# Patient Record
Sex: Male | Born: 1965 | Race: White | Hispanic: No | Marital: Married | Smoking: Former smoker
Health system: Southern US, Community
[De-identification: ages and names within clinical notes are randomized; demographics above are authoritative.]

## PROBLEM LIST (undated history)

## (undated) DIAGNOSIS — R51 Headache: Secondary | ICD-10-CM

## (undated) DIAGNOSIS — G8929 Other chronic pain: Secondary | ICD-10-CM

## (undated) DIAGNOSIS — F319 Bipolar disorder, unspecified: Secondary | ICD-10-CM

## (undated) DIAGNOSIS — R519 Headache, unspecified: Secondary | ICD-10-CM

## (undated) HISTORY — DX: Other chronic pain: G89.29

## (undated) HISTORY — DX: Headache: R51

## (undated) HISTORY — DX: Bipolar disorder, unspecified: F31.9

## (undated) HISTORY — DX: Headache, unspecified: R51.9

---

## 2001-09-29 ENCOUNTER — Observation Stay (HOSPITAL_COMMUNITY): Admission: RE | Admit: 2001-09-29 | Discharge: 2001-09-30 | Payer: Self-pay | Admitting: Urology

## 2001-09-29 ENCOUNTER — Encounter: Payer: Self-pay | Admitting: Emergency Medicine

## 2001-10-15 ENCOUNTER — Ambulatory Visit (HOSPITAL_BASED_OUTPATIENT_CLINIC_OR_DEPARTMENT_OTHER): Admission: RE | Admit: 2001-10-15 | Discharge: 2001-10-15 | Payer: Self-pay | Admitting: Urology

## 2002-05-10 ENCOUNTER — Encounter: Payer: Self-pay | Admitting: Internal Medicine

## 2002-05-10 ENCOUNTER — Encounter: Admission: RE | Admit: 2002-05-10 | Discharge: 2002-05-10 | Payer: Self-pay | Admitting: Internal Medicine

## 2002-06-28 ENCOUNTER — Emergency Department (HOSPITAL_COMMUNITY): Admission: EM | Admit: 2002-06-28 | Discharge: 2002-06-28 | Payer: Self-pay | Admitting: Emergency Medicine

## 2002-08-29 ENCOUNTER — Encounter: Payer: Self-pay | Admitting: Emergency Medicine

## 2002-08-29 ENCOUNTER — Emergency Department (HOSPITAL_COMMUNITY): Admission: EM | Admit: 2002-08-29 | Discharge: 2002-08-29 | Payer: Self-pay | Admitting: Emergency Medicine

## 2015-08-19 DIAGNOSIS — G479 Sleep disorder, unspecified: Secondary | ICD-10-CM | POA: Insufficient documentation

## 2015-08-19 DIAGNOSIS — R200 Anesthesia of skin: Secondary | ICD-10-CM | POA: Insufficient documentation

## 2015-08-19 DIAGNOSIS — G43009 Migraine without aura, not intractable, without status migrainosus: Secondary | ICD-10-CM | POA: Insufficient documentation

## 2015-09-23 DIAGNOSIS — M5412 Radiculopathy, cervical region: Secondary | ICD-10-CM | POA: Insufficient documentation

## 2015-10-21 DIAGNOSIS — M5030 Other cervical disc degeneration, unspecified cervical region: Secondary | ICD-10-CM | POA: Insufficient documentation

## 2016-07-06 ENCOUNTER — Ambulatory Visit (INDEPENDENT_AMBULATORY_CARE_PROVIDER_SITE_OTHER): Payer: BLUE CROSS/BLUE SHIELD | Admitting: Pulmonary Disease

## 2016-07-06 ENCOUNTER — Encounter: Payer: Self-pay | Admitting: Pulmonary Disease

## 2016-07-06 VITALS — BP 120/80 | HR 74

## 2016-07-06 DIAGNOSIS — R05 Cough: Secondary | ICD-10-CM

## 2016-07-06 DIAGNOSIS — R059 Cough, unspecified: Secondary | ICD-10-CM

## 2016-07-06 DIAGNOSIS — R918 Other nonspecific abnormal finding of lung field: Secondary | ICD-10-CM | POA: Diagnosis not present

## 2016-07-06 LAB — NITRIC OXIDE: Nitric Oxide: 20

## 2016-07-06 MED ORDER — PREDNISONE 10 MG PO TABS: ORAL_TABLET | ORAL | 0 refills | Status: DC

## 2016-07-06 MED ORDER — CHLORPHENIRAMINE MALEATE 4 MG PO TABS: 4.0000 mg | ORAL_TABLET | Freq: Every day | ORAL | 0 refills | Status: DC

## 2016-07-06 MED ORDER — AZITHROMYCIN 250 MG PO TABS: ORAL_TABLET | ORAL | 0 refills | Status: DC

## 2016-07-06 NOTE — Progress Notes (Signed)
   Subjective:    Patient ID: Philip Landry, male    DOB: [birth year], 51 y.o.   MRN: [id number]  HPI    Review of Systems  Constitutional: Negative for fever and unexpected weight change.  HENT: Negative for congestion, dental problem, ear pain, nosebleeds, postnasal drip, rhinorrhea, sinus pressure, sneezing, sore throat and trouble swallowing.   Eyes: Negative for redness and itching.  Respiratory: Positive for cough ( productive) and shortness of breath. Negative for chest tightness and wheezing.   Cardiovascular: Negative for palpitations and leg swelling.  Gastrointestinal: Negative for nausea and vomiting.  Genitourinary: Negative for dysuria.  Musculoskeletal: Negative for joint swelling.  Skin: Negative for rash.  Neurological: Positive for headaches.  Hematological: Does not bruise/bleed easily.  Psychiatric/Behavioral: Negative for dysphoric mood. The patient is not nervous/anxious.        Objective:   Physical Exam        Assessment & Plan:

## 2016-07-06 NOTE — Progress Notes (Signed)
Past surgical history He  has no past surgical history on file.  Family history His family history is not on file.  Social history He  reports that he quit smoking about 10 years ago. His smoking use included Cigarettes. He smoked 1.00 pack per day. He has never used smokeless tobacco. He reports that he does not drink alcohol or use drugs.  Allergies  Allergen Reactions  . Codeine Nausea And Vomiting    Review of Systems  Constitutional: Negative for fever and unexpected weight change.  HENT: Negative for congestion, dental problem, ear pain, nosebleeds, postnasal drip, rhinorrhea, sinus pressure, sneezing, sore throat and trouble swallowing.   Eyes: Negative for redness and itching.  Respiratory: Positive for cough ( productive) and shortness of breath. Negative for chest tightness and wheezing.   Cardiovascular: Negative for palpitations and leg swelling.  Gastrointestinal: Negative for nausea and vomiting.  Genitourinary: Negative for dysuria.  Musculoskeletal: Negative for joint swelling.  Skin: Negative for rash.  Neurological: Positive for headaches.  Hematological: Does not bruise/bleed easily.  Psychiatric/Behavioral: Negative for dysphoric mood. The patient is not nervous/anxious.     No current outpatient prescriptions on file prior to visit.   No current facility-administered medications on file prior to visit.     Chief Complaint  Patient presents with  . Pulmonary Consult    coughing all the time, dry annoying cough without any mucus production, only relief is when he lays down, started with bronchitis and the cough never went away    Pulmonary tests CT chest 05/08/16 >> 4 mm RUL nodule, 3 mm RML nodule FeNO 07/06/16 >> 20  Past medical history He  has a past medical history of Bipolar depression (HCC) and Chronic headaches.  Vital signs BP 120/80 (BP Location: Left Arm, Cuff Size: Normal)   Pulse 74   SpO2 96%   History of present illness Philip Landry is a 51 y.o. male former smoker with cough.  He developed a respiratory infection in January.  He has persistent cough since then.  His cough is dry and happens more when he sits up or stands up.  If he lays down it seems to get better.  He felt better on prednisone and antibiotics.  He was seen by Dr. Blenda Nicely in [place].  He has been tried on therapies for asthma, post nasal drip, and reflux.  None of these have helped.  He denies sinus congestion, post nasal drip, sore throat, globus, chest pain, wheeze, fever, hemoptysis, skin rash, leg swelling.  He denies sick exposures.  No history of asthma, allergies, pneumonia or tuberculosis.  Denies travel history.  Owns a Science writer.  Has pet dogs.  Started smoking at 72, smoke 3/4 ppd, quit 2008.  CT chest from February 2018 showed small nodules.  Physical exam  General - No distress Eyes - pupils reactive ENT - No sinus tenderness, no oral exudate, no LAN, no thyromegaly, TM clear, pupils equal/reactive Cardiac - s1s2 regular, no murmur, pulses symmetric Chest - No wheeze/rales/dullness, good air entry, normal respiratory excursion Back - No focal tenderness Abd - Soft, non-tender, no organomegaly, + bowel sounds Ext - No edema Neuro - Normal strength, cranial nerves intact Skin - No rashes Psych - Normal mood, and behavior   Discussion 51 yo male former smoker with persistent, non productive cough after recent respiratory infection.  He has not improved with therapies for asthma, reflux, or post nasal drip.  Assessment/plan  Chronic cough. - will give additional  course of prednisone and zpak - will have him use chlorpheniramine 4 mg qhs for now - he can do salt water gargles - sip water with urge to cough - use sugarless candy to keep mouth moist - voice rest - if no improvement, then will need assessment by ENT  Lung nodules with hx of tobacco abuse. - will need f/u CT chest w/o contrast in February  2019   Patient Instructions  Prednisone 10 mg pill >> 3 pills daily for 2 days, 2 pills daily for 2 days, 1 pill daily for 2 days Zithromax 250 mg pill >> 2 pills on day 1, then 1 pill daily for next 4 days Chlorpheniramine 4 mg pill nightly Salt water gargles twice per day Sip water when you have urge to cough Sugarless candy to keep mouth moist 1 teaspoon of local honey twice per day Rest your voice as able  Follow up in 4 weeks with Dr. Craige Cotta or Nurse practitioner    Coralyn Helling, MD [facility] Pager:  [phone] 07/06/2016, 2:55 PM

## 2016-07-06 NOTE — Patient Instructions (Signed)
Prednisone 10 mg pill >> 3 pills daily for 2 days, 2 pills daily for 2 days, 1 pill daily for 2 days Zithromax 250 mg pill >> 2 pills on day 1, then 1 pill daily for next 4 days Chlorpheniramine 4 mg pill nightly Salt water gargles twice per day Sip water when you have urge to cough Sugarless candy to keep mouth moist 1 teaspoon of local honey twice per day Rest your voice as able  Follow up in 4 weeks with Dr. Craige Cotta or Nurse practitioner

## 2016-08-02 ENCOUNTER — Ambulatory Visit (INDEPENDENT_AMBULATORY_CARE_PROVIDER_SITE_OTHER): Payer: BLUE CROSS/BLUE SHIELD | Admitting: Adult Health

## 2016-08-02 ENCOUNTER — Encounter: Payer: Self-pay | Admitting: Adult Health

## 2016-08-02 DIAGNOSIS — R05 Cough: Secondary | ICD-10-CM | POA: Diagnosis not present

## 2016-08-02 DIAGNOSIS — R053 Chronic cough: Secondary | ICD-10-CM

## 2016-08-02 MED ORDER — BENZONATATE 200 MG PO CAPS: 200.0000 mg | ORAL_CAPSULE | Freq: Three times a day (TID) | ORAL | 1 refills | Status: DC | PRN

## 2016-08-02 NOTE — Patient Instructions (Addendum)
Begin Delsym 2 tsp Twice daily  , for cough .  Begin Tessalon Three times a day  As needed  For cough  Begin Zyrtec 10mg  ..daily in am .  Continue on Chlortrimeton 4mg  2 At bedtime .  Begin Prilosec 20 mg daily before meal in am .  Sips of water to soothe throat .  No MINTS .   Follow up Dr. Craige CottaSood  In 6 weeks and As needed   Please contact office for sooner follow up if symptoms do not improve or worsen or seek emergency care

## 2016-08-02 NOTE — Progress Notes (Signed)
 @Patient  ID: Philip Landry, male    DOB: [birth year], 51 y.o.   MRN: [id number]  Chief Complaint  Patient presents with  . Follow-up    Cough     Referring provider: No ref. provider found  HPI: 51 year old male former smoker seen for pulmonary consult 07/06/2016 for chronic cough  TEST  Pulmonary tests CT chest 05/08/16 >> 4 mm RUL nodule, 3 mm RML nodule FeNO 07/06/16 >> 20  08/02/2016 Follow up : Chronic cough  Patient presents for a one-month follow-up. Patient was seen last month for pulmonary consult for chronic cough. Patient complains that he's had a persistent cough since January 2018 after he developed an upper rest or infection. He has been treated with prednisone and antibiotics. CT chest on February 25 showed a 4 mm right upper lobe nodule and a 3 mm right middle lobe nodule. Patient was given a course of Z-Pak and prednisone. Started on Chlor-Trimeton at bedtime.. Patient returns today with improvement . Decreased cough but not gone.  Was seen by Dr. Esther Hardyhondri in [place] . Had PFT , told no Asthma or COPD per pt . No records available.      Allergies  Allergen Reactions  . Codeine Nausea And Vomiting     There is no immunization history on file for this patient.  Past Medical History:  Diagnosis Date  . Bipolar depression (HCC)   . Chronic headaches     Tobacco History: History  Smoking Status  . Former Smoker  . Packs/day: 1.00  . Years: 8.00  . Types: Cigarettes  . Quit date: 2008  Smokeless Tobacco  . Never Used   Counseling given: Not Answered   Outpatient Encounter Prescriptions as of 08/02/2016  Medication Sig  . chlorpheniramine (CHLOR-TRIMETON) 4 MG tablet Take 1 tablet (4 mg total) by mouth at bedtime.  . lamoTRIgine (LAMICTAL) 200 MG tablet Take 400 mg by mouth daily.  Marland Kitchen. zolpidem (AMBIEN) 10 MG tablet Take 10 mg by mouth at bedtime as needed for sleep.  . benzonatate (TESSALON) 200 MG capsule Take 1 capsule (200 mg total) by mouth 3  (three) times daily as needed for cough.  . [DISCONTINUED] azithromycin (ZITHROMAX) 250 MG tablet 2 pills on day 1, then 1 pill daily for next 4 days (Patient not taking: Reported on 08/02/2016)  . [DISCONTINUED] predniSONE (DELTASONE) 10 MG tablet 3 pills daily for 2 days, 2 pills daily for 2 days, 1 pill daily for 2 days (Patient not taking: Reported on 08/02/2016)   No facility-administered encounter medications on file as of 08/02/2016.      Review of Systems  Constitutional:   No  weight loss, night sweats,  Fevers, chills, fatigue, or  lassitude.  HEENT:   No headaches,  Difficulty swallowing,  Tooth/dental problems, or  Sore throat,                No sneezing, itching, ear ache,  +nasal congestion, post nasal drip,   CV:  No chest pain,  Orthopnea, PND, swelling in lower extremities, anasarca, dizziness, palpitations, syncope.   GI  No heartburn, indigestion, abdominal pain, nausea, vomiting, diarrhea, change in bowel habits, loss of appetite, bloody stools.   Resp:   No chest wall deformity  Skin: no rash or lesions.  GU: no dysuria, change in color of urine, no urgency or frequency.  No flank pain, no hematuria   MS:  No joint pain or swelling.  No decreased range of motion.  No back pain.  Physical Exam  BP 118/74 (BP Location: Left Arm, Cuff Size: Normal)   Pulse 73   SpO2 97%   GEN: A/Ox3; pleasant , NAD, well nourished   HEENT:  [place]/AT,  EACs-clear, TMs-wnl, NOSE-clear, THROAT-clear, no lesions, no postnasal drip or exudate noted.   NECK:  Supple w/ fair ROM; no JVD; normal carotid impulses w/o bruits; no thyromegaly or nodules palpated; no lymphadenopathy.    RESP  Clear  P & A; w/o, wheezes/ rales/ or rhonchi. no accessory muscle use, no dullness to percussion  CARD:  RRR, no m/r/g, no peripheral edema, pulses intact, no cyanosis or clubbing.  GI:   Soft & nt; nml bowel sounds; no organomegaly or masses detected.   Musco: Warm bil, no deformities or joint  swelling noted.   Neuro: alert, no focal deficits noted.    Skin: Warm, no lesions or rashes    Lab Results:  CBC No results found for: WBC, RBC, HGB, HCT, PLT, MCV, MCH, MCHC, RDW, LYMPHSABS, MONOABS, EOSABS, BASOSABS  BMET No results found for: NA, K, CL, CO2, GLUCOSE, BUN, CREATININE, CALCIUM, GFRNONAA, GFRAA  BNP No results found for: BNP  ProBNP No results found for: PROBNP  Imaging: No results found.   Assessment & Plan:   Chronic cough Upper airway cough syndrome - some improvement on regimen for cough control and AR prevention  Add GERD and maximize AR/Cough prevention  If not better refer to ENT  Has had PFT , will try to get results.   Plan  . Patient Instructions  Begin Delsym 2 tsp Twice daily  , for cough .  Begin Tessalon Three times a day  As needed  For cough  Begin Zyrtec 10mg  ..daily in am .  Continue on Chlortrimeton 4mg  2 At bedtime .  Begin Prilosec 20 mg daily before meal in am .  Sips of water to soothe throat .  No MINTS .   Follow up Dr. Craige Cotta  In 6 weeks and As needed   Please contact office for sooner follow up if symptoms do not improve or worsen or seek emergency care            Rubye Oaks, NP 08/02/2016

## 2016-08-02 NOTE — Assessment & Plan Note (Signed)
Upper airway cough syndrome - some improvement on regimen for cough control and AR prevention  Add GERD and maximize AR/Cough prevention  If not better refer to ENT  Has had PFT , will try to get results.   Plan  . Patient Instructions  Begin Delsym 2 tsp Twice daily  , for cough .  Begin Tessalon Three times a day  As needed  For cough  Begin Zyrtec 10mg  ..daily in am .  Continue on Chlortrimeton 4mg  2 At bedtime .  Begin Prilosec 20 mg daily before meal in am .  Sips of water to soothe throat .  No MINTS .   Follow up Dr. Craige CottaSood  In 6 weeks and As needed   Please contact office for sooner follow up if symptoms do not improve or worsen or seek emergency care

## 2016-08-03 NOTE — Progress Notes (Signed)
I have reviewed and agree with assessment/plan.  Coralyn HellingVineet Owens Hara, MD [facility] 08/03/2016, 11:30 AM Pager:  [phone]

## 2016-08-05 ENCOUNTER — Ambulatory Visit: Payer: BLUE CROSS/BLUE SHIELD | Admitting: Adult Health

## 2016-09-13 ENCOUNTER — Ambulatory Visit: Payer: BLUE CROSS/BLUE SHIELD | Admitting: Adult Health

## 2016-10-14 ENCOUNTER — Telehealth: Payer: Self-pay | Admitting: Pulmonary Disease

## 2016-10-14 ENCOUNTER — Ambulatory Visit (INDEPENDENT_AMBULATORY_CARE_PROVIDER_SITE_OTHER): Payer: BLUE CROSS/BLUE SHIELD | Admitting: Adult Health

## 2016-10-14 ENCOUNTER — Encounter: Payer: Self-pay | Admitting: Adult Health

## 2016-10-14 DIAGNOSIS — R05 Cough: Secondary | ICD-10-CM | POA: Diagnosis not present

## 2016-10-14 DIAGNOSIS — R911 Solitary pulmonary nodule: Secondary | ICD-10-CM | POA: Diagnosis not present

## 2016-10-14 DIAGNOSIS — R053 Chronic cough: Secondary | ICD-10-CM

## 2016-10-14 NOTE — Assessment & Plan Note (Signed)
Improved response w/ trigger prevention and cough control meds.   Plan  Patient Instructions  May use Delsym 2 tsp Twice daily  , for cough .  Use Tessalon Three times a day  As needed  For cough  Continue on Zyrtec 10mg  ..daily in am .  Continue on Chlortrimeton 4mg  2 At bedtime .  Continue on Prilosec 20 mg daily before meal in am .  Sips of water to soothe throat .  No MINTS .   Follow up Dr. Craige CottaSood  In 3 months and As needed   Set up CT chest in 04/2017 .  Please contact office for sooner follow up if symptoms do not improve or worsen or seek emergency care

## 2016-10-14 NOTE — Patient Instructions (Addendum)
May use Delsym 2 tsp Twice daily  , for cough .  Use Tessalon Three times a day  As needed  For cough  Continue on Zyrtec 10mg  ..daily in am .  Continue on Chlortrimeton 4mg  2 At bedtime .  Continue on Prilosec 20 mg daily before meal in am .  Sips of water to soothe throat .  No MINTS .   Follow up Dr. Craige CottaSood  In 3 months and As needed   Set up CT chest in 04/2017 .  Please contact office for sooner follow up if symptoms do not improve or worsen or seek emergency care

## 2016-10-14 NOTE — Assessment & Plan Note (Signed)
follow up CT-scan of the chest in 04/2017

## 2016-10-14 NOTE — Progress Notes (Signed)
 @Patient  ID: Philip Landry, male    DOB: Sep 06, 1965, 51 y.o.   MRN: [id number]  Chief Complaint  Patient presents with  . Follow-up    Cough    Referring provider: No ref. provider found  HPI: 51 year old male former smoker seen for pulmonary consult 07/06/2016 for chronic cough  TEST  Pulmonary tests CT chest 05/08/16 >> 4 mm RUL nodule, 3 mm RML nodule FeNO 07/06/16 >> 20  10/14/2016 Follow up : Chronic cough  Patient presents for a two-month follow-up. Patient was seen in April for Pulmicort consult for chronic cough. He has been having a persistent cough since January 2018. He been treated with prednisone and antibiotics. CT chest showed 4 mm right upper lobe and 3 mm right middle lobe. Patient was treated with cough control regimen with Delsym Tessalon. Started on Zyrtec and Chlor-Trimeton. Also started on Prilosec for reflux. Patient returns today feeling better . Cough is significantly decreased but not totally gone. Stopped using allergy meds.  Still has some throat clearing.  Patient denies any chest pain, orthopnea, PND, or increased leg swelling.  Allergies  Allergen Reactions  . Codeine Nausea And Vomiting     There is no immunization history on file for this patient.  Past Medical History:  Diagnosis Date  . Bipolar depression (HCC)   . Chronic headaches     Tobacco History: History  Smoking Status  . Former Smoker  . Packs/day: 1.00  . Years: 8.00  . Types: Cigarettes  . Quit date: 2008  Smokeless Tobacco  . Never Used   Counseling given: Not Answered   Outpatient Encounter Prescriptions as of 10/14/2016  Medication Sig  . lamoTRIgine (LAMICTAL) 200 MG tablet 1 in the morning and 2 at bedtime  . loratadine (CLARITIN) 10 MG tablet Take 10 mg by mouth daily.  Marland Kitchen. omeprazole (PRILOSEC) 40 MG capsule Take 40 mg by mouth daily.  Marland Kitchen. zolpidem (AMBIEN) 10 MG tablet Take 10 mg by mouth at bedtime as needed for sleep.  . benzonatate (TESSALON) 200 MG  capsule Take 1 capsule (200 mg total) by mouth 3 (three) times daily as needed for cough. (Patient not taking: Reported on 10/14/2016)  . chlorpheniramine (CHLOR-TRIMETON) 4 MG tablet Take 1 tablet (4 mg total) by mouth at bedtime. (Patient not taking: Reported on 10/14/2016)   No facility-administered encounter medications on file as of 10/14/2016.      Review of Systems  Constitutional:   No  weight loss, night sweats,  Fevers, chills, fatigue, or  lassitude.  HEENT:   No headaches,  Difficulty swallowing,  Tooth/dental problems, or  Sore throat,                No sneezing, itching, ear ache,  +nasal congestion, post nasal drip,   CV:  No chest pain,  Orthopnea, PND, swelling in lower extremities, anasarca, dizziness, palpitations, syncope.   GI  No heartburn, indigestion, abdominal pain, nausea, vomiting, diarrhea, change in bowel habits, loss of appetite, bloody stools.   Resp:    No wheezing.  No chest wall deformity  Skin: no rash or lesions.  GU: no dysuria, change in color of urine, no urgency or frequency.  No flank pain, no hematuria   MS:  No joint pain or swelling.  No decreased range of motion.  No back pain.    Physical Exam  BP 124/74 (BP Location: Left Arm, Cuff Size: Normal)   Pulse 69   Ht 5\' 5"  (1.651 m)  Wt 145 lb 6.4 oz (66 kg)   SpO2 97%   BMI 24.20 kg/m   GEN: A/Ox3; pleasant , NAD thin    HEENT:  [place]/AT,  EACs-clear, TMs-wnl, NOSE-clear, THROAT-clear, no lesions, no postnasal drip or exudate noted.   NECK:  Supple w/ fair ROM; no JVD; normal carotid impulses w/o bruits; no thyromegaly or nodules palpated; no lymphadenopathy.    RESP  Clear  P & A; w/o, wheezes/ rales/ or rhonchi. no accessory muscle use, no dullness to percussion  CARD:  RRR, no m/r/g, no peripheral edema, pulses intact, no cyanosis or clubbing.  GI:   Soft & nt; nml bowel sounds; no organomegaly or masses detected.   Musco: Warm bil, no deformities or joint swelling noted.    Neuro: alert, no focal deficits noted.    Skin: Warm, no lesions or rashes    Lab Results:  CBC No results found for: WBC, RBC, HGB, HCT, PLT, MCV, MCH, MCHC, RDW, LYMPHSABS, MONOABS, EOSABS, BASOSABS  BMET No results found for: NA, K, CL, CO2, GLUCOSE, BUN, CREATININE, CALCIUM, GFRNONAA, GFRAA  BNP No results found for: BNP  ProBNP No results found for: PROBNP  Imaging: No results found.   Assessment & Plan:   Chronic cough Improved response w/ trigger prevention and cough control meds.   Plan  Patient Instructions  May use Delsym 2 tsp Twice daily  , for cough .  Use Tessalon Three times a day  As needed  For cough  Continue on Zyrtec 10mg  ..daily in am .  Continue on Chlortrimeton 4mg  2 At bedtime .  Continue on Prilosec 20 mg daily before meal in am .  Sips of water to soothe throat .  No MINTS .   Follow up Dr. Craige CottaSood  In 3 months and As needed   Set up CT chest in 04/2017 .  Please contact office for sooner follow up if symptoms do not improve or worsen or seek emergency care         Lung nodule follow up CT-scan of the chest in 04/2017      Rubye Oaksammy Parrett, NP 10/14/2016

## 2016-10-16 NOTE — Progress Notes (Signed)
I have reviewed and agree with assessment/plan.  Alverna Fawley, MD [facility]/Critical [facility] 10/16/2016, 11:28 PM Pager:  [phone]  

## 2016-10-17 NOTE — Telephone Encounter (Signed)
Error

## 2016-12-06 ENCOUNTER — Encounter (INDEPENDENT_AMBULATORY_CARE_PROVIDER_SITE_OTHER): Payer: BLUE CROSS/BLUE SHIELD | Admitting: Podiatry

## 2016-12-06 NOTE — Progress Notes (Signed)
This encounter was created in error - please disregard.

## 2016-12-20 ENCOUNTER — Encounter: Payer: Self-pay | Admitting: Podiatry

## 2016-12-20 ENCOUNTER — Ambulatory Visit (INDEPENDENT_AMBULATORY_CARE_PROVIDER_SITE_OTHER): Payer: BLUE CROSS/BLUE SHIELD

## 2016-12-20 ENCOUNTER — Ambulatory Visit (INDEPENDENT_AMBULATORY_CARE_PROVIDER_SITE_OTHER): Payer: BLUE CROSS/BLUE SHIELD | Admitting: Podiatry

## 2016-12-20 DIAGNOSIS — M722 Plantar fascial fibromatosis: Secondary | ICD-10-CM

## 2016-12-20 MED ORDER — METHYLPREDNISOLONE 4 MG PO TBPK: ORAL_TABLET | ORAL | 0 refills | Status: DC

## 2016-12-20 MED ORDER — MELOXICAM 15 MG PO TABS: 15.0000 mg | ORAL_TABLET | Freq: Every day | ORAL | 3 refills | Status: DC

## 2016-12-20 NOTE — Patient Instructions (Signed)

## 2016-12-20 NOTE — Progress Notes (Signed)
  Subjective:  Patient ID: Philip Landry, male    DOB: [birth year],  MRN: [id number] HPI Chief Complaint  Patient presents with  . Foot Pain    Plantar heel/arch left - aching x several months, AM pain, tried heel lift from chiropractor, bought Brooks sneakers-made worse    51 y.o. male presents with the above complaint. He states that his plantar heel and arch pain to his left foot has been present for about 6-8 months. He states that mornings are particularly bad he show healed left from hot chiropractor which he thinks made this worse he bought a pair of tennis shoes which she also thinks may have worsened the condition. Denies any trauma.   Past Medical History:  Diagnosis Date  . Bipolar depression (HCC)   . Chronic headaches    No past surgical history on file.  Current Outpatient Prescriptions:  .  eszopiclone (LUNESTA) 2 MG TABS tablet, TAKE 1/2-1 TABLET BY MOUTH AT BEDTIME AS NEEDED DO NOT TAKE WITHIN 30 MINUTES OF EATING, Disp: , Rfl: 2 .  lamoTRIgine (LAMICTAL) 200 MG tablet, 1 in the morning and 2 at bedtime, Disp: , Rfl:  .  loratadine (CLARITIN) 10 MG tablet, Take 10 mg by mouth daily., Disp: , Rfl:  .  omeprazole (PRILOSEC) 40 MG capsule, Take 40 mg by mouth daily., Disp: , Rfl:   Allergies  Allergen Reactions  . Codeine Nausea And Vomiting   Review of Systems  All other systems reviewed and are negative.  Objective:  There were no vitals filed for this visit.  General: Well developed, nourished, in no acute distress, alert and oriented x3   Dermatological: Skin is warm, dry and supple bilateral. Nails x 10 are well maintained; remaining integument appears unremarkable at this time. There are no open sores, no preulcerative lesions, no rash or signs of infection present.  Vascular: Dorsalis Pedis artery and Posterior Tibial artery pedal pulses are 2/4 bilateral with immedate capillary fill time. Pedal hair growth present. No varicosities and no lower extremity  edema present bilateral.   Neruologic: Grossly intact via light touch bilateral. Vibratory intact via tuning fork bilateral. Protective threshold with Semmes Wienstein monofilament intact to all pedal sites bilateral. Patellar and Achilles deep tendon reflexes 2+ bilateral. No Babinski or clonus noted bilateral.   Musculoskeletal: No gross boney pedal deformities bilateral. No pain, crepitus, or limitation noted with foot and ankle range of motion bilateral. Muscular strength 5/5 in all groups tested bilateral. Minor severe pain on palpation medial calcaneal tubercle of the left heel. No medial lateral compression. Right heel is nontender and supple.  Gait: Unassisted, Nonantalgic.    Radiographs:  Radiographs taken today demonstrate soft tissue increase in density at the plantar fascia calcaneal insertion site of the left heel. No fractures are noted.  Assessment & Plan:   Assessment: Plantar fasciitis left.  Plan: Discussed etiology pathology conservative versus surgical therapies. At this point we started him on Medrol Dosepak to be followed by meloxicam. Injected left heel today with a local anesthetic placement plantar fascia brace and ice splint. Discussed prep and shoe gear stretching exercises ice therapy sugar modifications. He was given both oral and written home going instructions for care of this problem as well as stretching exercises.     Max T. Sargent, [place]

## 2017-01-20 ENCOUNTER — Ambulatory Visit: Payer: BLUE CROSS/BLUE SHIELD | Admitting: Pulmonary Disease

## 2017-01-26 ENCOUNTER — Ambulatory Visit: Payer: BLUE CROSS/BLUE SHIELD | Admitting: Podiatry

## 2017-01-26 ENCOUNTER — Encounter: Payer: Self-pay | Admitting: Podiatry

## 2017-01-26 DIAGNOSIS — M722 Plantar fascial fibromatosis: Secondary | ICD-10-CM

## 2017-01-26 NOTE — Progress Notes (Signed)
He presents today states that he was doing very well she states that he really doesn't like the night boot he figured that his work and the weekends is making his foot hurt.  Objective: Vital signs are stable alert and oriented 3 he has pain on palpation medial calcaneal tubercle of the right heel.  Assessment: Plantar fasciitis right.  Plan: Injected his right heel again today with Kenalog and local anesthetic referred him to Philip Landry for orthotics.

## 2017-02-06 ENCOUNTER — Encounter: Payer: Self-pay | Admitting: Pulmonary Disease

## 2017-02-06 ENCOUNTER — Ambulatory Visit: Payer: BLUE CROSS/BLUE SHIELD | Admitting: Pulmonary Disease

## 2017-02-06 VITALS — BP 110/68 | HR 69 | Ht 65.0 in | Wt 147.4 lb

## 2017-02-06 DIAGNOSIS — R918 Other nonspecific abnormal finding of lung field: Secondary | ICD-10-CM

## 2017-02-06 DIAGNOSIS — R05 Cough: Secondary | ICD-10-CM

## 2017-02-06 DIAGNOSIS — R519 Headache, unspecified: Secondary | ICD-10-CM

## 2017-02-06 DIAGNOSIS — K219 Gastro-esophageal reflux disease without esophagitis: Secondary | ICD-10-CM | POA: Diagnosis not present

## 2017-02-06 DIAGNOSIS — R059 Cough, unspecified: Secondary | ICD-10-CM

## 2017-02-06 DIAGNOSIS — R51 Headache: Secondary | ICD-10-CM | POA: Diagnosis not present

## 2017-02-06 DIAGNOSIS — R058 Other specified cough: Secondary | ICD-10-CM

## 2017-02-06 MED ORDER — AZELASTINE-FLUTICASONE 137-50 MCG/ACT NA SUSP: 1.0000 | Freq: Two times a day (BID) | NASAL | 5 refills | Status: AC

## 2017-02-06 MED ORDER — MONTELUKAST SODIUM 10 MG PO TABS
10.0000 mg | ORAL_TABLET | Freq: Every day | ORAL | 5 refills | Status: DC
Start: 2017-02-06 — End: 2017-05-05

## 2017-02-06 MED ORDER — CHLORPHENIRAMINE MALEATE 4 MG PO TABS: 4.0000 mg | ORAL_TABLET | Freq: Every day | ORAL | 0 refills | Status: AC

## 2017-02-06 NOTE — Progress Notes (Signed)
Current Outpatient Medications on File Prior to Visit  Medication Sig  . eszopiclone (LUNESTA) 2 MG TABS tablet TAKE 1/2-1 TABLET BY MOUTH AT BEDTIME AS NEEDED DO NOT TAKE WITHIN 30 MINUTES OF EATING  . lamoTRIgine (LAMICTAL) 200 MG tablet 1 in the morning and 2 at bedtime  . loratadine (CLARITIN) 10 MG tablet Take 10 mg by mouth daily.  . meloxicam (MOBIC) 15 MG tablet Take 1 tablet (15 mg total) by mouth daily.  Marland Kitchen. omeprazole (PRILOSEC) 40 MG capsule Take 40 mg by mouth daily.   No current facility-administered medications on file prior to visit.      Chief Complaint  Patient presents with  . Follow-up    Pt doing better, has dry cough on and off on occassion.     Pulmonary tests CT chest 05/08/16 >> 4 mm RUL nodule, 3 mm RML nodule FeNO 07/06/16 >> 20  Past medical history Bipolar, Headaches  Past surgical history, Family history, Social history, Allergies all reviewed.  Vital Signs BP 110/68 (BP Location: Left Arm, Cuff Size: Normal)   Pulse 69   Ht 5\' 5"  (1.651 m)   Wt 147 lb 6.4 oz (66.9 kg)   SpO2 97%   BMI 24.53 kg/m   History of Present Illness Philip Landry is a 51 y.o. male former smoker with cough and lung nodules.  He still gets dry cough.  He has irritation in her throat and feels like something is stuck in there.  He gets sinus pressure and then a headache.  This happens with change in temperature, or with different exposures.  He has been using prilosec, but not sure this has made a difference.  Tessalon didn't help.  He has been sipping water.  He still gets the urge to clear his throat.  He is not having wheeze, or chest tightness.  No fever, sputum, swelling, or rashes.  He is using claritin.  He doesn't remember if chlorpheniramine helped or not.  [place], but we haven't received this yet.  He didn't tolerate flonase >> bad taste.  Physical Exam  General - No distress Eyes - pupils reactive ENT - No sinus tenderness, mild septal  deviation, pale nasal mucosa, no oral exudate, no LAN, TM clear b/l Cardiac - s1s2 regular, no murmur Chest - No wheeze/rales/dullness Back - No focal tenderness Abd - Soft, non-tender Ext - No edema Neuro - Normal strength Skin - No rashes Psych - normal mood, and behavior   Assessment/Plan  Chronic cough from upper airway cough syndrome. - likely from irritant rhinitis leading to sinus headaches - he has mild septal deviaiton - will have him try dymista - restart chlorpheniramine - add singulair - get copy of PFT from [place] - will arrange for CT sinus when he gets CT chest in February 2019  Possible laryngopharyngeal reflux. - seems less likely - will have him gradually taper of prilosec  Lung nodules. - will need f/u CT chest w/o contrast in February 2019   Patient Instructions  Chlorpheniramine 4 mg pill nightly Montelukast (singulair) 10 mg pill nighty Dymista 1 spray each nostril twice per day Sip water with urge to cough Salt water gargles twice per day as needed Avoid forcing cough or clearing throat Can try decreasing prilosec to 20 mg daily >> if okay after two weeks, then can try stopping prilosec  Will get a copy of your breathing test from Dr. Terrilee Croakhodri's office in [place]  Will schedule CT sinus for  February 2019 when you have your CT chest  Follow up in 3 months    Coralyn HellingVineet Malachai Schalk, MD [facility] Pager:  [phone] 02/06/2017, 10:07 AM

## 2017-02-06 NOTE — Patient Instructions (Addendum)
Chlorpheniramine 4 mg pill nightly Montelukast (singulair) 10 mg pill nighty Dymista 1 spray each nostril twice per day Sip water with urge to cough Salt water gargles twice per day as needed Avoid forcing cough or clearing throat Can try decreasing prilosec to 20 mg daily >> if okay after two weeks, then can try stopping prilosec  Will get a copy of your breathing test from Dr. Terrilee Croakhodri's office in [place]  Will schedule CT sinus for February 2019 when you have your CT chest  Follow up in 3 months

## 2017-02-09 ENCOUNTER — Ambulatory Visit (INDEPENDENT_AMBULATORY_CARE_PROVIDER_SITE_OTHER): Payer: BLUE CROSS/BLUE SHIELD | Admitting: Orthotics

## 2017-02-09 DIAGNOSIS — M722 Plantar fascial fibromatosis: Secondary | ICD-10-CM

## 2017-02-09 NOTE — Progress Notes (Signed)

## 2017-03-09 ENCOUNTER — Ambulatory Visit: Payer: BLUE CROSS/BLUE SHIELD | Admitting: Podiatry

## 2017-03-09 ENCOUNTER — Ambulatory Visit: Payer: BLUE CROSS/BLUE SHIELD | Admitting: Orthotics

## 2017-03-09 ENCOUNTER — Encounter: Payer: Self-pay | Admitting: Podiatry

## 2017-03-09 DIAGNOSIS — M722 Plantar fascial fibromatosis: Secondary | ICD-10-CM

## 2017-03-09 NOTE — Progress Notes (Signed)
Patient came in today to pick up custom made foot orthotics.  The goals were accomplished and the patient reported no dissatisfaction with said orthotics.  Patient was advised of breakin period and how to report any issues. 

## 2017-03-09 NOTE — Progress Notes (Signed)
Philip Landry presents today for to pick up his orthotics.  He states that his left heel is really bothering him.  Objective: Pain on palpation left heel.  Assessment: Plantar fasciitis left foot.  Plan: Dispensed orthotics and dispensed a new plantar fascial brace.

## 2017-05-03 ENCOUNTER — Ambulatory Visit (INDEPENDENT_AMBULATORY_CARE_PROVIDER_SITE_OTHER)
Admission: RE | Admit: 2017-05-03 | Discharge: 2017-05-03 | Disposition: A | Discharge status: Home / Self Care | Payer: BLUE CROSS/BLUE SHIELD | Source: Ambulatory Visit | Attending: Pulmonary Disease | Admitting: Pulmonary Disease

## 2017-05-03 ENCOUNTER — Other Ambulatory Visit: Payer: BLUE CROSS/BLUE SHIELD

## 2017-05-03 DIAGNOSIS — R918 Other nonspecific abnormal finding of lung field: Secondary | ICD-10-CM

## 2017-05-05 ENCOUNTER — Ambulatory Visit: Payer: BLUE CROSS/BLUE SHIELD | Admitting: Pulmonary Disease

## 2017-05-05 ENCOUNTER — Encounter: Payer: Self-pay | Admitting: Pulmonary Disease

## 2017-05-05 VITALS — BP 122/82 | HR 69 | Ht 65.0 in | Wt 148.0 lb

## 2017-05-05 DIAGNOSIS — Z7722 Contact with and (suspected) exposure to environmental tobacco smoke (acute) (chronic): Secondary | ICD-10-CM | POA: Diagnosis not present

## 2017-05-05 DIAGNOSIS — K219 Gastro-esophageal reflux disease without esophagitis: Secondary | ICD-10-CM | POA: Diagnosis not present

## 2017-05-05 DIAGNOSIS — R918 Other nonspecific abnormal finding of lung field: Secondary | ICD-10-CM

## 2017-05-05 DIAGNOSIS — R05 Cough: Secondary | ICD-10-CM | POA: Diagnosis not present

## 2017-05-05 DIAGNOSIS — R058 Other specified cough: Secondary | ICD-10-CM

## 2017-05-05 NOTE — Patient Instructions (Signed)
Follow up in 1 year.

## 2017-05-05 NOTE — Progress Notes (Signed)
[facility]  Chief Complaint  Patient presents with  . Follow-up    Pt cough is better, but still there at times. Pt is not coughing as much    Vital signs: BP 122/82 (BP Location: Left Arm, Cuff Size: Normal)   Pulse 69   Ht 5\' 5"  (1.651 m)   Wt 148 lb (67.1 kg)   SpO2 97%   BMI 24.63 kg/m   History of Present Illness: Philip Landry is a 52 y.o. male former smoker with cough and lung nodules.  He is here to review his CT chest.  Mild paraseptal emphysema, stable tiny nodule.  His cough is much better.  He is still using claritin and prilosec.  astelin had a funny taste.  He is not having wheeze,chest tightness, fever, hemoptysis, dyspnea, skin rash, or leg swelling.  He doesn't smoke, but has second hand exposure at work.   Physical Exam:  General - pleasant Eyes - pupils reactive ENT - no sinus tenderness, no oral exudate, no LAN Cardiac - regular, no murmur Chest - no wheeze, rales Abd - soft, non tender Ext - no edema Skin - no rashes Neuro - normal strength Psych - normal mood  Assessment/Plan:  Upper airway cough syndrome. - continue claritin  Laryngopharyngeal reflux. - continue prilosec  Lung nodules. - CT chest reviewed with him - no additional follow up needed  History of tobacco abuse. Mild paraseptal emphysema. Second hand tobacco exposure. - had extensive discussion about how second hand tobacco exposure can impact his lung function   Patient Instructions  Follow up in 1 year    Coralyn HellingVineet Deosha Werden, MD [facility] 05/05/2017, 4:24 PM Pager:  [phone]  Flow Sheet  Pulmonary tests: CT chest 05/08/16 >> 4 mm RUL nodule, 3 mm RML nodule FeNO 07/06/16 >> 20 CT chest 05/03/17 >> mild paraseptal emphysema, 3 mm subpleural nodes  Past Medical History: He  has a past medical history of Bipolar depression (HCC) and Chronic headaches.  Past Surgical History: He  has no past  surgical history on file.  Family History: His family history is not on file.  Social History: He  reports that he quit smoking about 11 years ago. His smoking use included cigarettes. He has a 8.00 pack-year smoking history. he has never used smokeless tobacco. He reports that he does not drink alcohol or use drugs.  Medications: Allergies as of 05/05/2017      Reactions   Codeine Nausea And Vomiting      Medication List        Accurate as of 05/05/17  4:24 PM. Always use your most recent med list.          Azelastine-Fluticasone 137-50 MCG/ACT Susp Place 1 spray into the nose 2 (two) times daily.   chlorpheniramine 4 MG tablet Commonly known as:  CHLOR-TRIMETON Take 1 tablet (4 mg total) by mouth at bedtime.   lamoTRIgine 200 MG tablet Commonly known as:  LAMICTAL 1 in the morning and 2 at bedtime   loratadine 10 MG tablet Commonly known as:  CLARITIN Take 10 mg by mouth daily.   omeprazole 40 MG capsule Commonly known as:  PRILOSEC Take 40 mg by mouth daily.

## 2017-07-18 ENCOUNTER — Other Ambulatory Visit: Payer: Self-pay | Admitting: Pulmonary Disease

## 2018-03-23 ENCOUNTER — Telehealth: Payer: Self-pay | Admitting: Podiatry

## 2018-03-23 NOTE — Telephone Encounter (Signed)
Pt left message stating he is in need of a new pair of inserts for his shoes.  I returned call and had to leave a message that since it has been over a year since pt was seen he would need to see the doctor for an updated visit so we could bill the insurance if he wanted [place] to bill the insurance. We could get him in to see Raiford Noble on the same day. I asked pt to call me to schedule the  appts.

## 2018-04-03 ENCOUNTER — Encounter: Payer: Self-pay | Admitting: Podiatry

## 2018-04-03 ENCOUNTER — Ambulatory Visit: Payer: BLUE CROSS/BLUE SHIELD | Admitting: Podiatry

## 2018-04-03 DIAGNOSIS — M722 Plantar fascial fibromatosis: Secondary | ICD-10-CM | POA: Diagnosis not present

## 2018-04-03 NOTE — Progress Notes (Signed)
He presented today with a history of plantar fasciitis and really on and wanted to see [facility] for orthotics.  Orthotics were casted today.

## 2018-04-26 ENCOUNTER — Ambulatory Visit (INDEPENDENT_AMBULATORY_CARE_PROVIDER_SITE_OTHER): Payer: BLUE CROSS/BLUE SHIELD | Admitting: Orthotics

## 2018-04-26 DIAGNOSIS — M722 Plantar fascial fibromatosis: Secondary | ICD-10-CM

## 2018-04-26 NOTE — Progress Notes (Signed)
Patient came in today to pick up custom made foot orthotics.  The goals were accomplished and the patient reported no dissatisfaction with said orthotics.  Patient was advised of breakin period and how to report any issues. 

## 2019-05-12 IMAGING — CT CT CHEST W/O CM
2 of 3 series · 15 of 36 positions shown, 18 images · non-contrast
Comparison: 05/08/2016.

CLINICAL DATA: Lung nodules.

EXAM:
CT CHEST WITHOUT CONTRAST
TECHNIQUE: Multidetector CT imaging of the chest was performed following the
standard protocol without IV contrast.

[Series 2: thorax · axial · 0.70mm/px · z∈[-274,-30]mm · 12 of 144 slices shown, 15 images]
[im 11/144  mediastinal]
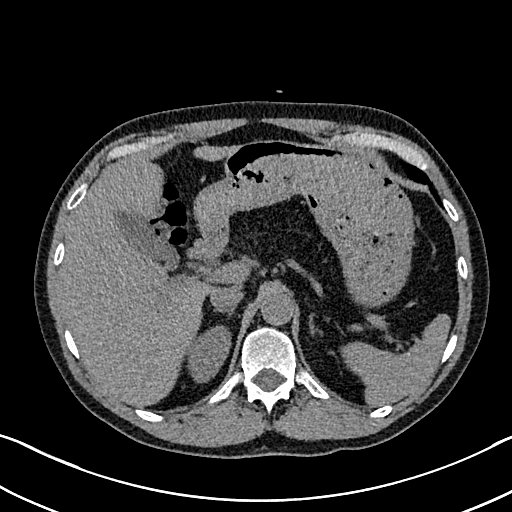
[im 11/144  lung]
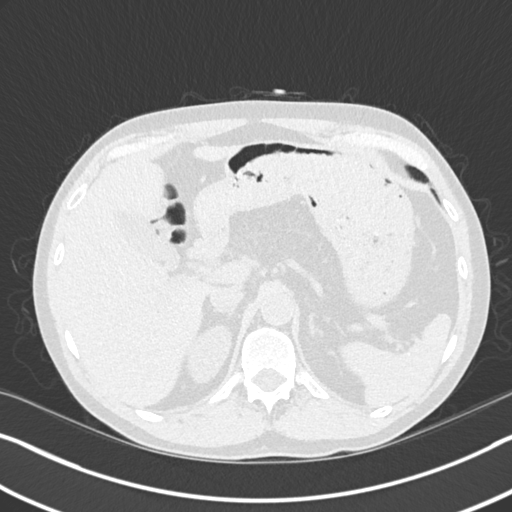
[im 22/144  lung]
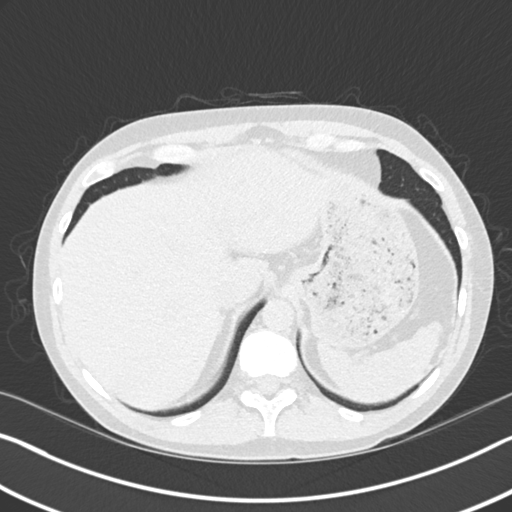
[im 32/144  lung]
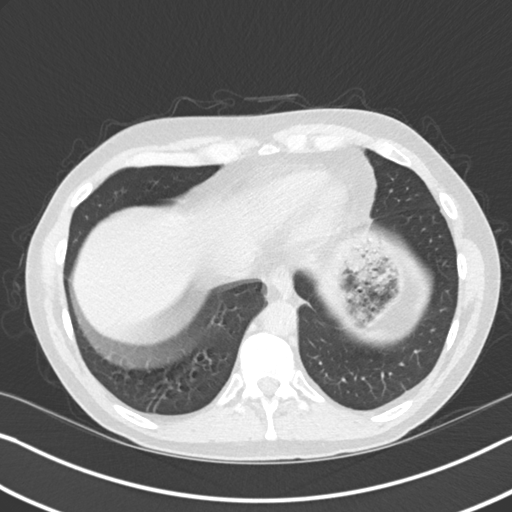
[im 43/144  lung]
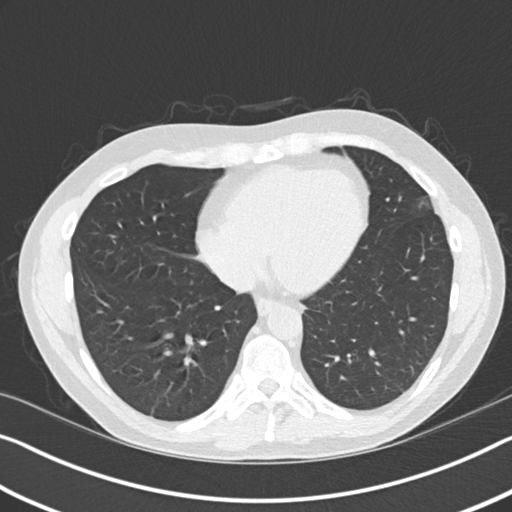
[im 53/144  mediastinal]
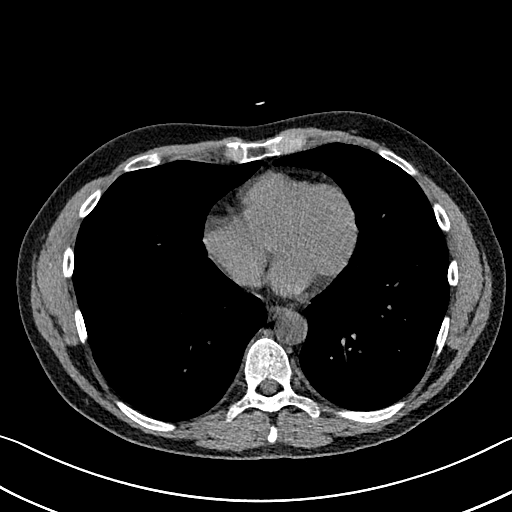
[im 53/144  lung]
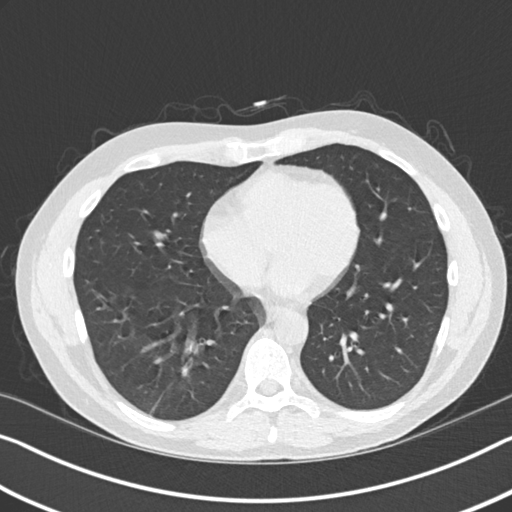
[im 64/144  lung]
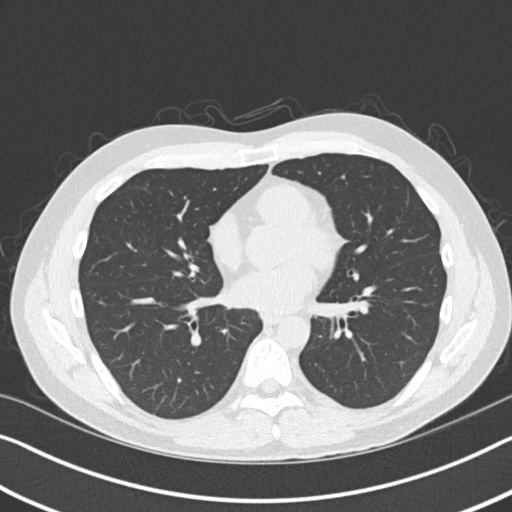
[im 80/144  lung]
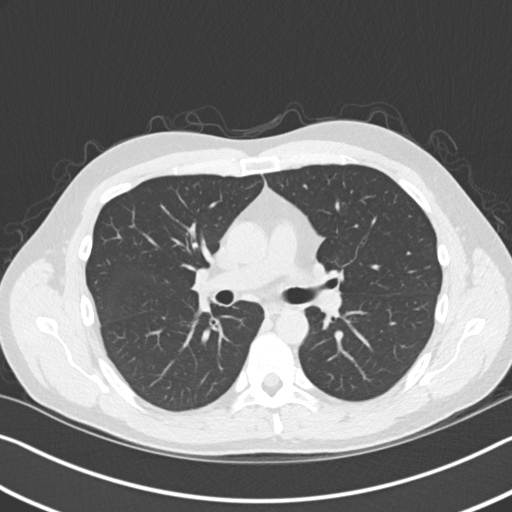
[im 91/144  lung]
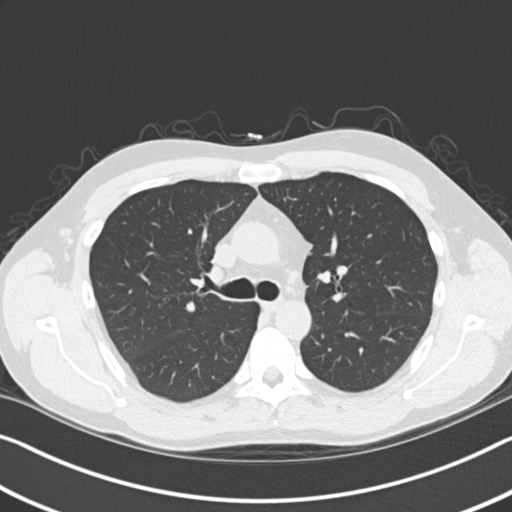
[im 101/144  mediastinal]
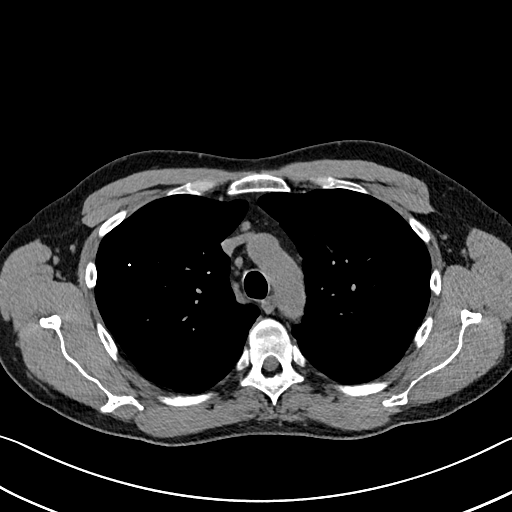
[im 101/144  lung]
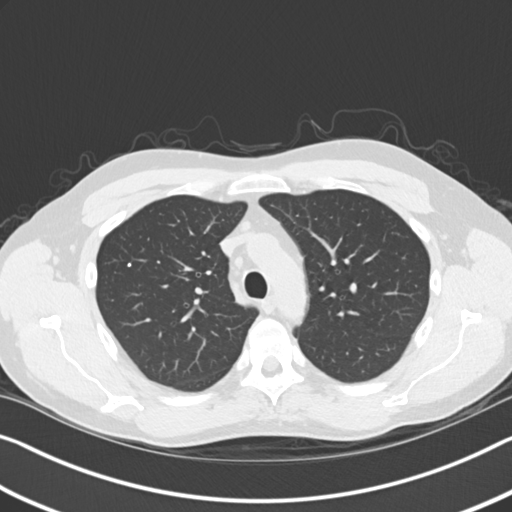
[im 112/144  lung]
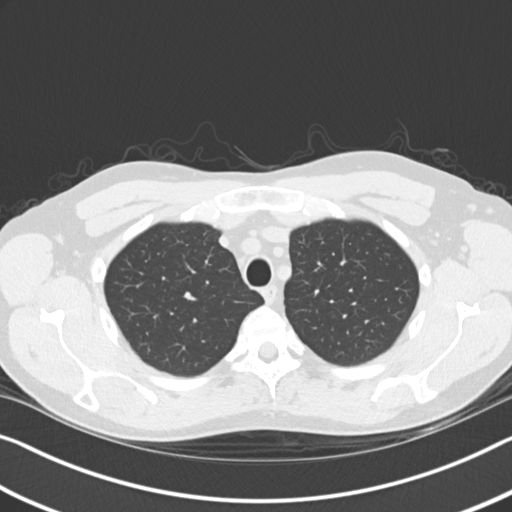
[im 122/144  lung]
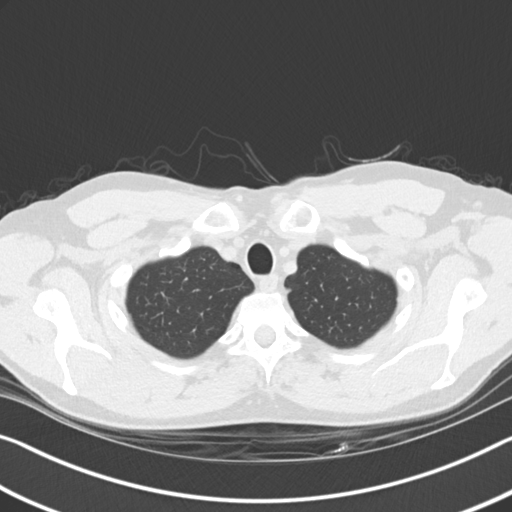
[im 133/144  lung]
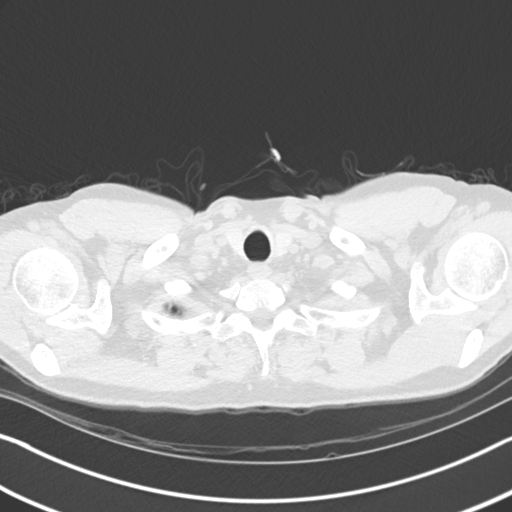

[Series 5: coronal · coronal · 0.59mm/px · 3 of 114 slices shown]
[im 23/114  lung]
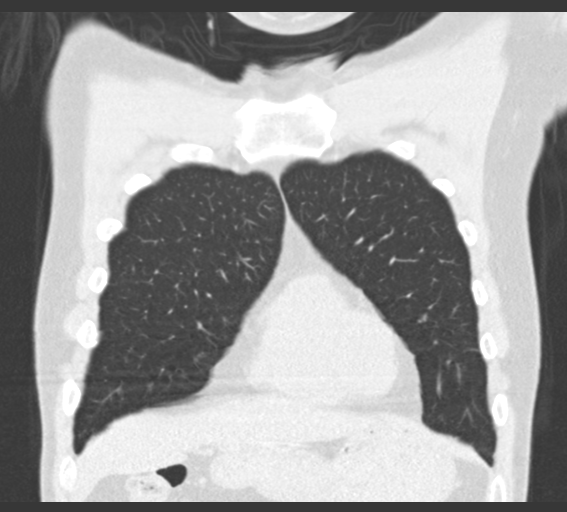
[im 46/114  lung]
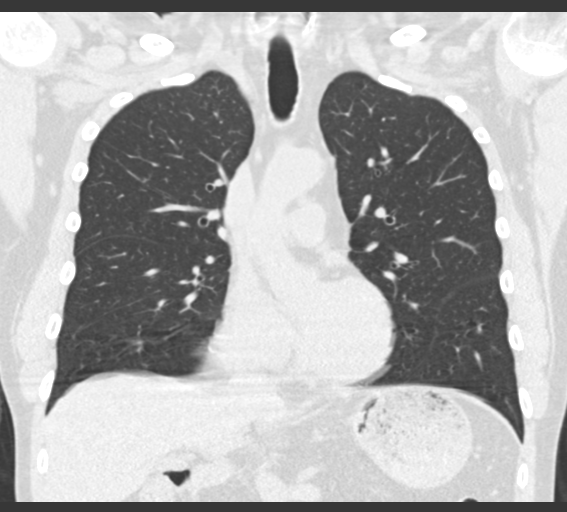
[im 68/114  lung]
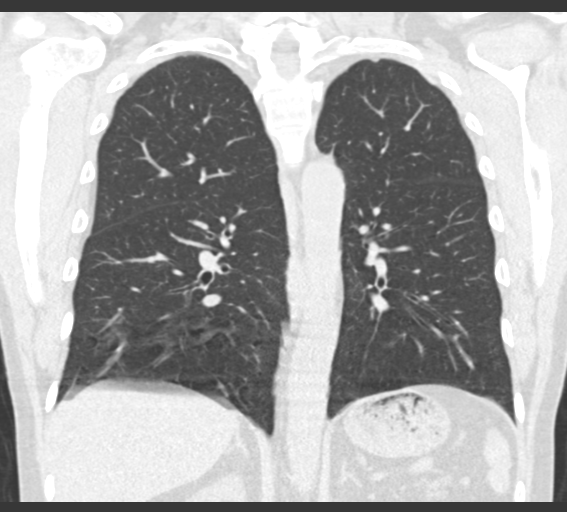

[15 of 36 positions shown; findings below may reference images not displayed]

FINDINGS: Cardiovascular: Lung bases show no acute findings. Heart size
normal. No pericardial effusion.

Mediastinum/Nodes: Mediastinal lymph nodes are not enlarged by CT
size criteria. Hilar regions are difficult to definitively evaluate
without IV contrast but appear grossly unremarkable. No axillary
adenopathy. Esophagus is grossly unremarkable.

Lungs/Pleura: Mild paraseptal emphysema. 3 mm subpleural lymph nodes
along minor fissure are unchanged. Image quality at the lung bases
is degraded by respiratory motion. No pleural fluid. Airway is
unremarkable.

Upper Abdomen: Visualized portions of the liver, gallbladder,
adrenal glands, kidneys and spleen are unremarkable. Pancreas is
fatty replaced. Visualized portion of the stomach and bowel is
unremarkable.

Musculoskeletal: No worrisome lytic or sclerotic lesions.
IMPRESSION: Tiny subpleural lymph node along the minor fissure are unchanged and
benign.

## 2020-07-31 ENCOUNTER — Other Ambulatory Visit: Payer: Self-pay

## 2020-07-31 ENCOUNTER — Emergency Department (HOSPITAL_COMMUNITY)
Admission: EM | Admit: 2020-07-31 | Discharge: 2020-07-31 | Disposition: A | Discharge status: Home / Self Care | Payer: BC Managed Care – PPO | Attending: Emergency Medicine | Admitting: Emergency Medicine

## 2020-07-31 ENCOUNTER — Encounter (HOSPITAL_COMMUNITY): Payer: Self-pay | Admitting: Emergency Medicine

## 2020-07-31 DIAGNOSIS — Z4800 Encounter for change or removal of nonsurgical wound dressing: Secondary | ICD-10-CM | POA: Diagnosis not present

## 2020-07-31 DIAGNOSIS — Z5189 Encounter for other specified aftercare: Secondary | ICD-10-CM

## 2020-07-31 DIAGNOSIS — X58XXXD Exposure to other specified factors, subsequent encounter: Secondary | ICD-10-CM | POA: Insufficient documentation

## 2020-07-31 DIAGNOSIS — Z87891 Personal history of nicotine dependence: Secondary | ICD-10-CM | POA: Insufficient documentation

## 2020-07-31 DIAGNOSIS — S61411D Laceration without foreign body of right hand, subsequent encounter: Secondary | ICD-10-CM | POA: Diagnosis not present

## 2020-07-31 DIAGNOSIS — S6991XD Unspecified injury of right wrist, hand and finger(s), subsequent encounter: Secondary | ICD-10-CM | POA: Diagnosis present

## 2020-07-31 NOTE — ED Provider Notes (Signed)
[facility]-EMERGENCY DEPT Provider Note   CSN: [id number] Arrival date & time: 07/31/20  1823     History Chief Complaint  Patient presents with  . Extremity Laceration    Philip Landry is a 55 y.o. male who presents with healing laceration to the right palm that happened 4 days ago.  Patient states he did not present for medical evaluation after the incident, rather superglue to the at home.  He did follow-up with his primary care doctor who initially evaluated the wound and ruled out retained foreign body.  PCP placed him on oral Bactrim.  He has been keeping the wound clean and dry, presents today for wound evaluation to ensure no infection is forming.  He endorses compliance with his Bactrim, denies any new swelling, purulent drainage, surrounding redness or pain to the wound.  Denies any numbness, tingling, weakness in his hand or pain and redness streaking up his forearm.  Denies any fevers or chills.  He is up-to-date on his tetanus vaccine, most recent in 2020.  I personally reviewed this patient's medical records.  His history of bipolar depression and chronic headaches.  HPI     Past Medical History:  Diagnosis Date  . Bipolar depression (HCC)   . Chronic headaches     Patient Active Problem List   Diagnosis Date Noted  . Lung nodule 10/14/2016  . Chronic cough 08/02/2016  . DDD (degenerative disc disease), cervical 10/21/2015  . Cervical radiculopathy 09/23/2015  . Migraine without aura and without status migrainosus, not intractable 08/19/2015  . Right arm numbness 08/19/2015  . Sleeping difficulties 08/19/2015    History reviewed. No pertinent surgical history.     Family History  Problem Relation Age of Onset  . Alpha-1 antitrypsin deficiency Neg Hx   . Asthma Neg Hx   . Breast cancer Neg Hx   . Stroke Neg Hx   . Colon cancer Neg Hx   . Coronary artery disease Neg Hx   . COPD Neg Hx   . Cystic fibrosis Neg Hx   . Diabetes  Neg Hx   . Deep vein thrombosis Neg Hx   . Hypertension Neg Hx   . Hyperlipidemia Neg Hx   . Kidney disease Neg Hx   . Liver disease Neg Hx   . Lung cancer Neg Hx   . Lupus Neg Hx   . Neurofibromatosis Neg Hx   . Osteoporosis Neg Hx   . Ovarian cancer Neg Hx   . Positive PPD/TB Exposure Neg Hx   . Prostate cancer Neg Hx   . Pulmonary embolism Neg Hx   . Pulmonary fibrosis Neg Hx   . Rheum arthritis Neg Hx   . Sarcoidosis Neg Hx   . Sleep apnea Neg Hx   . Thyroid disease Neg Hx   . Tuberculosis Neg Hx   . Tuberous sclerosis Neg Hx     Social History   Tobacco Use  . Smoking status: Former Smoker    Packs/day: 1.00    Years: 8.00    Pack years: 8.00    Types: Cigarettes    Quit date: 2008    Years since quitting: 14.3  . Smokeless tobacco: Never Used  Vaping Use  . Vaping Use: Former  Substance Use Topics  . Alcohol use: No  . Drug use: No    Home Medications Prior to Admission medications   Medication Sig Start Date End Date Taking? Authorizing Provider  Azelastine-Fluticasone 137-50 MCG/ACT SUSP Place 1  spray into the nose 2 (two) times daily. 02/06/17   Coralyn Helling, MD  chlorpheniramine (CHLOR-TRIMETON) 4 MG tablet Take 1 tablet (4 mg total) by mouth at bedtime. 02/06/17   Coralyn Helling, MD  diazepam (VALIUM) 5 MG tablet  03/21/18   [provider]  lamoTRIgine (LAMICTAL) 200 MG tablet 1 in the morning and 2 at bedtime    [provider]  loratadine (CLARITIN) 10 MG tablet Take 10 mg by mouth daily.    [provider]  omeprazole (PRILOSEC) 40 MG capsule Take 40 mg by mouth daily.    [provider]  zolpidem (AMBIEN) 10 MG tablet  03/26/18   [provider]    Allergies    Codeine  Review of Systems   Review of Systems  Constitutional: Negative.   HENT: Negative.   Respiratory: Negative.   Cardiovascular: Negative.   Gastrointestinal: Negative.   Skin: Positive for wound.  Neurological: Negative.      Physical Exam Updated Vital Signs BP (!) 163/92   Pulse 72   Temp 98.8 F (37.1 C) (Oral)   Resp 18   Ht 5\' 7"  (1.702 m)   Wt 73.9 kg   SpO2 97%   BMI 25.53 kg/m   Physical Exam Vitals and nursing note reviewed.  HENT:     Head: Normocephalic and atraumatic.  Eyes:     General: No scleral icterus.       Right eye: No discharge.        Left eye: No discharge.     Conjunctiva/sclera: Conjunctivae normal.  Cardiovascular:     Rate and Rhythm: Normal rate.     Pulses: Normal pulses.     Heart sounds: Normal heart sounds.  Pulmonary:     Effort: Pulmonary effort is normal. No tachypnea, bradypnea, accessory muscle usage or prolonged expiration.     Breath sounds: Normal breath sounds.  Musculoskeletal:     Right hand: Laceration present. No swelling, deformity, tenderness or bony tenderness. Normal range of motion. Normal strength. Normal capillary refill. Normal pulse.     Left hand: Normal.       Hands:     Comments: Normal capillary refill in all 5 digits of the right hand, normal sensation.  Patient with full range of motion of the digits of the right hand, able to make a fist completely.  Skin:    General: Skin is warm and dry.     Capillary Refill: Capillary refill takes less than 2 seconds.  Neurological:     General: No focal deficit present.     Mental Status: He is alert.  Psychiatric:        Mood and Affect: Mood normal.     ED Results / Procedures / Treatments   Labs (all labs ordered are listed, but only abnormal results are displayed) Labs Reviewed - No data to display  EKG None  Radiology No results found.  Procedures Procedures   Medications Ordered in ED Medications - No data to display  ED Course  I have reviewed the triage vital signs and the nursing notes.  Pertinent labs & imaging results that were available during my care of the patient were reviewed by me and considered in my medical decision making (see chart for details).     MDM Rules/Calculators/A&P                          55 year old male presents with concern for  wound check to right palm laceration sustained 4 days prior.  Concern for wound infection, cellulitis, or abscess.  Vitals are normal intake.  Physical exam revealed healing 1.5 cm laceration to the right palm, without signs of surrounding infection.  No evidence of retained foreign body on physical exam.  Patient declined offered x-ray.  Will irrigate and redress the wound, no further work-up warranted in the ED at this time.  Recommend patient continue on oral antibiotic therapy and follow-up closely with his primary care doctor.  Gabriela voiced understanding of his medical evaluation and treatment plan.  Each of his questions was answered to her expressed satisfaction.  Return precautions given.  Patient is well-appearing, stable, and appropriate for discharge at this time.  This chart was dictated using voice recognition software, Dragon. Despite the best efforts of this provider to proofread and correct errors, errors may still occur which can change documentation meaning.  Final Clinical Impression(s) / ED Diagnoses Final diagnoses:  Visit for wound check    Rx / DC Orders ED Discharge Orders    None       Paris Lore, PA-C 08/01/20 0003    Charlynne Pander, MD 08/01/20 [phone]

## 2020-07-31 NOTE — Discharge Instructions (Addendum)
You were seen for recheck of the wound on your hand. There are no signs of infection at this time.   Please continue to take the antibiotic previously prescribed to you.   Return to the ER with any new swelling, thick yellow/white/green discharge from the wound, redness or pain extending up the fingers or into the wrist/forearm, or any other new severe symptoms.

## 2020-07-31 NOTE — ED Triage Notes (Signed)
Patient here from home reporting right hand laceration that happened on Monday. Reports possible infection.Last tetanus 2020.

## 2022-02-14 ENCOUNTER — Other Ambulatory Visit: Payer: Self-pay

## 2022-02-14 ENCOUNTER — Emergency Department (HOSPITAL_COMMUNITY): Payer: BC Managed Care – PPO

## 2022-02-14 ENCOUNTER — Encounter (HOSPITAL_COMMUNITY): Payer: Self-pay

## 2022-02-14 ENCOUNTER — Emergency Department (HOSPITAL_COMMUNITY)
Admission: EM | Admit: 2022-02-14 | Discharge: 2022-02-14 | Disposition: A | Discharge status: Home / Self Care | Payer: BC Managed Care – PPO | Attending: Emergency Medicine | Admitting: Emergency Medicine

## 2022-02-14 DIAGNOSIS — R531 Weakness: Secondary | ICD-10-CM | POA: Diagnosis present

## 2022-02-14 DIAGNOSIS — R791 Abnormal coagulation profile: Secondary | ICD-10-CM | POA: Insufficient documentation

## 2022-02-14 DIAGNOSIS — R200 Anesthesia of skin: Secondary | ICD-10-CM | POA: Insufficient documentation

## 2022-02-14 DIAGNOSIS — Z20822 Contact with and (suspected) exposure to covid-19: Secondary | ICD-10-CM | POA: Diagnosis not present

## 2022-02-14 DIAGNOSIS — R2681 Unsteadiness on feet: Secondary | ICD-10-CM

## 2022-02-14 DIAGNOSIS — R42 Dizziness and giddiness: Secondary | ICD-10-CM | POA: Diagnosis not present

## 2022-02-14 DIAGNOSIS — R4182 Altered mental status, unspecified: Secondary | ICD-10-CM | POA: Diagnosis not present

## 2022-02-14 LAB — RAPID URINE DRUG SCREEN, HOSP PERFORMED
Amphetamines: NOT DETECTED
Barbiturates: NOT DETECTED
Benzodiazepines: NOT DETECTED
Cocaine: NOT DETECTED
Opiates: NOT DETECTED
Tetrahydrocannabinol: NOT DETECTED

## 2022-02-14 LAB — COMPREHENSIVE METABOLIC PANEL
ALT: 36 U/L (ref 0–44)
AST: 27 U/L (ref 15–41)
Albumin: 4.4 g/dL (ref 3.5–5.0)
Alkaline Phosphatase: 123 U/L (ref 38–126)
Anion gap: 8 (ref 5–15)
BUN: 18 mg/dL (ref 6–20)
CO2: 22 mmol/L (ref 22–32)
Calcium: 9.6 mg/dL (ref 8.9–10.3)
Chloride: 110 mmol/L (ref 98–111)
Creatinine, Ser: 1.43 mg/dL — ABNORMAL HIGH (ref 0.61–1.24)
GFR, Estimated: 58 mL/min — ABNORMAL LOW (ref >60)
Glucose, Bld: 106 mg/dL — ABNORMAL HIGH (ref 70–99)
Potassium: 3.8 mmol/L (ref 3.5–5.1)
Sodium: 140 mmol/L (ref 135–145)
Total Bilirubin: 0.2 mg/dL — ABNORMAL LOW (ref 0.3–1.2)
Total Protein: 7.2 g/dL (ref 6.5–8.1)

## 2022-02-14 LAB — URINALYSIS, ROUTINE W REFLEX MICROSCOPIC
Bilirubin Urine: NEGATIVE
Glucose, UA: NEGATIVE mg/dL
Hgb urine dipstick: NEGATIVE
Ketones, ur: NEGATIVE mg/dL
Leukocytes,Ua: NEGATIVE
Nitrite: NEGATIVE
Protein, ur: NEGATIVE mg/dL
Specific Gravity, Urine: 1.025 (ref 1.005–1.030)
pH: 5 (ref 5.0–8.0)

## 2022-02-14 LAB — I-STAT CHEM 8, ED
BUN: 19 mg/dL (ref 6–20)
Calcium, Ion: 1.09 mmol/L — ABNORMAL LOW (ref 1.15–1.40)
Chloride: 107 mmol/L (ref 98–111)
Creatinine, Ser: 1.3 mg/dL — ABNORMAL HIGH (ref 0.61–1.24)
Glucose, Bld: 105 mg/dL — ABNORMAL HIGH (ref 70–99)
HCT: 45 % (ref 39.0–52.0)
Hemoglobin: 15.3 g/dL (ref 13.0–17.0)
Potassium: 3.8 mmol/L (ref 3.5–5.1)
Sodium: 141 mmol/L (ref 135–145)
TCO2: 20 mmol/L — ABNORMAL LOW (ref 22–32)

## 2022-02-14 LAB — CBC
HCT: 44.8 % (ref 39.0–52.0)
Hemoglobin: 15.1 g/dL (ref 13.0–17.0)
MCH: 30.6 pg (ref 26.0–34.0)
MCHC: 33.7 g/dL (ref 30.0–36.0)
MCV: 90.9 fL (ref 80.0–100.0)
Platelets: 239 10*3/uL (ref 150–400)
RBC: 4.93 MIL/uL (ref 4.22–5.81)
RDW: 12.5 % (ref 11.5–15.5)
WBC: 8.8 10*3/uL (ref 4.0–10.5)
nRBC: 0 % (ref 0.0–0.2)

## 2022-02-14 LAB — CBG MONITORING, ED: Glucose-Capillary: 102 mg/dL — ABNORMAL HIGH (ref 70–99)

## 2022-02-14 LAB — DIFFERENTIAL
Abs Immature Granulocytes: 0.04 10*3/uL (ref 0.00–0.07)
Basophils Absolute: 0.1 10*3/uL (ref 0.0–0.1)
Basophils Relative: 1 %
Eosinophils Absolute: 0.1 10*3/uL (ref 0.0–0.5)
Eosinophils Relative: 1 %
Immature Granulocytes: 1 %
Lymphocytes Relative: 35 %
Lymphs Abs: 3 10*3/uL (ref 0.7–4.0)
Monocytes Absolute: 0.6 10*3/uL (ref 0.1–1.0)
Monocytes Relative: 7 %
Neutro Abs: 4.9 10*3/uL (ref 1.7–7.7)
Neutrophils Relative %: 55 %

## 2022-02-14 LAB — PROTIME-INR
INR: 1 (ref 0.8–1.2)
Prothrombin Time: 13.2 seconds (ref 11.4–15.2)

## 2022-02-14 LAB — RESP PANEL BY RT-PCR (FLU A&B, COVID) ARPGX2
Influenza A by PCR: NEGATIVE
Influenza B by PCR: NEGATIVE
SARS Coronavirus 2 by RT PCR: NEGATIVE

## 2022-02-14 LAB — ETHANOL: Alcohol, Ethyl (B): <10 mg/dL (ref <10)

## 2022-02-14 LAB — APTT: aPTT: 26 seconds (ref 24–36)

## 2022-02-14 MED ORDER — METOCLOPRAMIDE HCL 5 MG/ML IJ SOLN
10.0000 mg | Freq: Once | INTRAMUSCULAR | Status: AC
  Administered 2022-02-14: 10 mg via INTRAVENOUS
  Filled 2022-02-14: qty 2

## 2022-02-14 MED ORDER — SODIUM CHLORIDE 0.9 % IV BOLUS
500.0000 mL | Freq: Once | INTRAVENOUS | Status: AC
  Administered 2022-02-14: 500 mL via INTRAVENOUS

## 2022-02-14 MED ORDER — SODIUM CHLORIDE 0.9% FLUSH
3.0000 mL | Freq: Once | INTRAVENOUS | Status: AC
  Administered 2022-02-14: 3 mL via INTRAVENOUS

## 2022-02-14 MED ORDER — IOHEXOL 350 MG/ML SOLN
75.0000 mL | Freq: Once | INTRAVENOUS | Status: AC | PRN
  Administered 2022-02-14: 75 mL via INTRAVENOUS

## 2022-02-14 NOTE — ED Notes (Signed)
Reviewed discharge instructions with patient and wife. Follow-up care reviewed. Patient and wife verbalized understanding. Patient A&Ox4, VSS upon discharge.  °

## 2022-02-14 NOTE — Code Documentation (Addendum)
Stroke Response Nurse Documentation Code Documentation  Philip Landry is a 56 y.o. male arriving to [facility]  via [facility] on 02/14/2022 with past medical hx of migraines.  Code stroke was activated by EMS.   Patient from work where he was LKW at 1330 and now complaining of right sided weakness and trouble walking.  Stroke team at the bedside on patient arrival. Labs drawn and patient cleared for CT by Dr. Lockie Mola. Patient to CT with team. NIHSS 3, see documentation for details and code stroke times. Patient with upper right inconsistent quadrinopnia, right limb ataxia and right decreased sensation on exam. The following imaging was completed:  CT Head, CTA, and MRI. Patient is not a candidate for IV Thrombolytic due to no acute stroke on MRI and stroke not suspected per MD Selina Cooley. Patient is not a candidate for IR due to no LVO noted on CTA per MD.   Code Stroke cancelled at 1509.   Bedside handoff with ED RN Irving Burton.    Lucila Maine  Stroke Response RN

## 2022-02-14 NOTE — ED Notes (Signed)
Pt provided w/ water and peanut butter crackers per MD

## 2022-02-14 NOTE — Consult Note (Signed)
Neurology Consultation  Reason for Consult: dizziness, diplopia, right sided weakness Referring Physician: Dr. Lockie Mola  CC: dizziness, diplopia, right sided weakness, right sensory deficit  History is obtained from:patient, EMS and chart  HPI: Philip Landry is a 56 y.o. male with history of migraines and bipolar disorder who presents after being unable to stand up while at work today.  Patient reported that he became dizzy room spinning and couldn't walk as a result.  Patient and his wife states that he has had several of these episodes of dizziness in the past, and that they often occur when he has not eaten and that they typically resolve when he eats.  Patient was noted to be normoglycemic upon arrival to the ED. Patient was altered. CT head NAICP. CTA no LVO. Vertigo significantly improved therefore patient was taken for STAT MRI brain before considering TNK. MRI brain showed no ischemia therefore TNK was not administered.   LKW: 1330 TNK given?: no, no stroke on MRI IR Thrombectomy? No, no LVO Modified Rankin Scale: 0-Completely asymptomatic and back to baseline post- stroke  ROS: A complete ROS was performed and is negative except as noted in the HPI.   Past Medical History:  Diagnosis Date   Bipolar depression (HCC)    Chronic headaches      Family History  Problem Relation Age of Onset   Alpha-1 antitrypsin deficiency Neg Hx    Asthma Neg Hx    Breast cancer Neg Hx    Stroke Neg Hx    Colon cancer Neg Hx    Coronary artery disease Neg Hx    COPD Neg Hx    Cystic fibrosis Neg Hx    Diabetes Neg Hx    Deep vein thrombosis Neg Hx    Hypertension Neg Hx    Hyperlipidemia Neg Hx    Kidney disease Neg Hx    Liver disease Neg Hx    Lung cancer Neg Hx    Lupus Neg Hx    Neurofibromatosis Neg Hx    Osteoporosis Neg Hx    Ovarian cancer Neg Hx    Positive PPD/TB Exposure Neg Hx    Prostate cancer Neg Hx    Pulmonary embolism Neg Hx    Pulmonary fibrosis Neg Hx     Rheum arthritis Neg Hx    Sarcoidosis Neg Hx    Sleep apnea Neg Hx    Thyroid disease Neg Hx    Tuberculosis Neg Hx    Tuberous sclerosis Neg Hx      Social History:   reports that he quit smoking about 15 years ago. His smoking use included cigarettes. He has a 8.00 pack-year smoking history. He has never used smokeless tobacco. He reports that he does not drink alcohol and does not use drugs.  Medications  Current Facility-Administered Medications:    sodium chloride flush (NS) 0.9 % injection 3 mL, 3 mL, Intravenous, Once, [place], Adam, DO  Current Outpatient Medications:    Azelastine-Fluticasone 137-50 MCG/ACT SUSP, Place 1 spray into the nose 2 (two) times daily., Disp: 1 Bottle, Rfl: 5   chlorpheniramine (CHLOR-TRIMETON) 4 MG tablet, Take 1 tablet (4 mg total) by mouth at bedtime., Disp: 14 tablet, Rfl: 0   diazepam (VALIUM) 5 MG tablet, , Disp: , Rfl:    lamoTRIgine (LAMICTAL) 200 MG tablet, 1 in the morning and 2 at bedtime, Disp: , Rfl:    loratadine (CLARITIN) 10 MG tablet, Take 10 mg by mouth daily., Disp: , Rfl:  omeprazole (PRILOSEC) 40 MG capsule, Take 40 mg by mouth daily., Disp: , Rfl:    zolpidem (AMBIEN) 10 MG tablet, , Disp: , Rfl:    Exam: Current vital signs: Wt 74.2 kg   BMI 25.62 kg/m  Vital signs in last 24 hours: Weight:  [74.2 kg] 74.2 kg (12/04 1400)  GENERAL: Awake, alert, in no acute distress Psych: Affect is labile, and patient is distractible with tangential speech but is cooperative with examination Head: Normocephalic and atraumatic, without obvious abnormality EENT: Normal conjunctivae, moist mucous membranes, no OP obstruction LUNGS: Normal respiratory effort. Non-labored breathing on room air Extremities: warm, well perfused, without obvious deformity  NEURO:  Mental Status: Awake, alert, and oriented to person, place, time, and situation, but distractible with poor concentration. He is not able to provide a clear and coherent  history of present illness. Speech/Language: speech is clear and fluent.   Fluency, and comprehension intact without aphasia  No neglect is noted Cranial Nerves:  II: PERRL right upper quadrantopia initially seen on exam but not on subsequent exams III, IV, VI: EOMI. Lid elevation symmetric and full.  V: Sensation is intact to light touch and diminished on the right VII: Face is symmetric resting and smiling.  VIII: Hearing intact to voice IX, X: Phonation normal.  XII: Tongue protrudes midline without fasciculations.   Motor: 5/5 strength is all muscle groups.  Tone is normal. Bulk is normal.  Sensation: Intact to light touch bilaterally in all four extremities but diminished on the right. No extinction to DSS present.  Coordination: FTN intact bilaterally. HTS difficult on L but no frank ataxia. No pronator drift.  Gait: Deferred  NIHSS: 1a Level of Conscious.: 0 1b LOC Questions: 0 1c LOC Commands: 0 2 Best Gaze: 0 3 Visual: 1 4 Facial Palsy: 0 5a Motor Arm - left: 0 5b Motor Arm - Right: 0 6a Motor Leg - Left: 0 6b Motor Leg - Right: 0 7 Limb Ataxia: 0 8 Sensory: 1 9 Best Language: 0 10 Dysarthria: 0 11 Extinct. and Inatten.: 0 TOTAL: 2   Labs I have reviewed labs in epic and the results pertinent to this consultation are:   CBC    Component Value Date/Time   WBC 8.8 02/14/2022 1436   RBC 4.93 02/14/2022 1436   HGB 15.1 02/14/2022 1436   HCT 44.8 02/14/2022 1436   PLT 239 02/14/2022 1436   MCV 90.9 02/14/2022 1436   MCH 30.6 02/14/2022 1436   MCHC 33.7 02/14/2022 1436   RDW 12.5 02/14/2022 1436   LYMPHSABS 3.0 02/14/2022 1436   MONOABS 0.6 02/14/2022 1436   EOSABS 0.1 02/14/2022 1436   BASOSABS 0.1 02/14/2022 1436    CMP     Component Value Date/Time   NA 141 02/14/2022 1434   K 3.8 02/14/2022 1434   CL 107 02/14/2022 1434   GLUCOSE 105 (H) 02/14/2022 1434   BUN 19 02/14/2022 1434   CREATININE 1.30 (H) 02/14/2022 1434    Lipid Panel  No  results found for: "CHOL", "TRIG", "HDL", "CHOLHDL", "VLDL", "LDLCALC", "LDLDIRECT"   Imaging I have reviewed the images obtained:  CT-scan of the brain: No acute abnormality  CTA head and neck:  No LVO or hemodynamically significant stenosis  MRI examination of the brain:  No acute infarct seen on DWI  Assessment: Patient with history of migraines and bipolar disorder presents after being unable to stand due to vertigo.  He is noted to be very distractible,, and speech is somewhat  tangential, although patient is alert and oriented to person place time and situation.  On exam, no weakness noted, but patient does state that he is unable to stand up due to dizziness.  Patient and his wife states that he has had about 3 of these episodes in the past where he becomes dizzy after not eating, and that these episodes resolve when he has eaten.  He has never been evaluated for any of these episodes before.  Head CT shows no acute abnormality, CTA head and neck shows no LVO and MRI shows no acute infarct on DWI.  While patient does have a history of migraines, he denies headache at this time, and does not have history of complex migraines.  Hints exam reveals no skew, no nystagmus, and patient unable to perform head turning test.  Impression: Atypical migraine versus peripheral dizziness  Recommendations: -Work-up of dizziness per primary team -Stroke code canceled - Neurology to sign off, please reconsult if new neurologic concerns arise  Pt seen by NP/Neuro and later by MD. Note/plan to be edited by MD as needed.  [place] , MSN, AGACNP-BC [facility] See Amion for schedule and pager information 02/14/2022 3:00 PM   Attending Neurohospitalist Addendum Patient seen and examined with APP/Resident. Agree with the history and physical as documented above. Agree with the plan as documented, which I helped formulate. I have edited the note above to reflect my full findings  and recommendations. I have independently reviewed the chart, obtained history, review of systems and examined the patient.I have personally reviewed pertinent head/neck/spine imaging (CT/MRI). Please feel free to call with any questions.  -- Su Monks, MD [facility] [phone]  If 7pm- 7am, please page neurology on call as listed in [place].

## 2022-02-14 NOTE — ED Provider Notes (Signed)
[facility] EMERGENCY DEPARTMENT Provider Note   CSN: [id number] Arrival date & time: 02/14/22  1426  An emergency department physician performed an initial assessment on this suspected stroke patient at 1431.  History  Chief Complaint  Patient presents with   Code Stroke    Philip Landry is a 56 y.o. male.  HPI Patient presents with altered mental status via EMS.  He is initially designated as a code stroke and initial evaluation conducted with the neurology colleagues.  Patient is typically done by his wife who assists with the history.  They note he has had several prior episodes of similar weakness, altered mental status.  About 3 hours prior to my evaluation the patient helped right arm weakness, difficulty with ambulation and gait, as well as right facial numbness.    Home Medications Prior to Admission medications   Medication Sig Start Date End Date Taking? Authorizing Provider  Azelastine-Fluticasone 137-50 MCG/ACT SUSP Place 1 spray into the nose 2 (two) times daily. 02/06/17   Coralyn Helling, MD  chlorpheniramine (CHLOR-TRIMETON) 4 MG tablet Take 1 tablet (4 mg total) by mouth at bedtime. 02/06/17   Coralyn Helling, MD  diazepam (VALIUM) 5 MG tablet  03/21/18   [provider]  lamoTRIgine (LAMICTAL) 200 MG tablet 1 in the morning and 2 at bedtime    [provider]  loratadine (CLARITIN) 10 MG tablet Take 10 mg by mouth daily.    [provider]  omeprazole (PRILOSEC) 40 MG capsule Take 40 mg by mouth daily.    [provider]  zolpidem (AMBIEN) 10 MG tablet  03/26/18   [provider]      Allergies    Codeine    Review of Systems   Review of Systems  All other systems reviewed and are negative.   Physical Exam Updated Vital Signs BP 128/75   Pulse 69   Temp 98.1 F (36.7 C) (Oral)   Resp 18   Ht 5\' 7"  (1.702 m)   Wt 74.2 kg   SpO2 94%   BMI 25.62 kg/m  Physical Exam Vitals and nursing note  reviewed.  Constitutional:      General: He is not in acute distress.    Appearance: He is well-developed.  HENT:     Head: Normocephalic and atraumatic.  Eyes:     Conjunctiva/sclera: Conjunctivae normal.  Cardiovascular:     Rate and Rhythm: Normal rate and regular rhythm.  Pulmonary:     Effort: Pulmonary effort is normal. No respiratory distress.     Breath sounds: No stridor.  Abdominal:     General: There is no distension.  Skin:    General: Skin is warm and dry.  Neurological:     Mental Status: He is alert and oriented to person, place, and time.     Comments: Face is symmetric, speech is clear.  No obvious cranial nerve abnormalities. Patient has some difficulty with holding his right arm against gravity, left arm unremarkable, lower extremities unremarkable.     ED Results / Procedures / Treatments   Labs (all labs ordered are listed, but only abnormal results are displayed) Labs Reviewed  COMPREHENSIVE METABOLIC PANEL - Abnormal; Notable for the following components:      Result Value   Glucose, Bld 106 (*)    Creatinine, Ser 1.43 (*)    Total Bilirubin 0.2 (*)    GFR, Estimated 58 (*)    All other components within normal limits  URINALYSIS,  ROUTINE W REFLEX MICROSCOPIC - Abnormal; Notable for the following components:   Color, Urine STRAW (*)    All other components within normal limits  I-STAT CHEM 8, ED - Abnormal; Notable for the following components:   Creatinine, Ser 1.30 (*)    Glucose, Bld 105 (*)    Calcium, Ion 1.09 (*)    TCO2 20 (*)    All other components within normal limits  CBG MONITORING, ED - Abnormal; Notable for the following components:   Glucose-Capillary 102 (*)    All other components within normal limits  RESP PANEL BY RT-PCR (FLU A&B, COVID) ARPGX2  PROTIME-INR  APTT  CBC  DIFFERENTIAL  ETHANOL  RAPID URINE DRUG SCREEN, HOSP PERFORMED    EKG EKG Interpretation  Date/Time:  Monday February 14 2022 16:18:54  EST Ventricular Rate:  67 PR Interval:  162 QRS Duration: 92 QT Interval:  422 QTC Calculation: 445 R Axis:   29 Text Interpretation: Normal sinus rhythm ST-t wave abnormality Abnormal ECG Confirmed by Gerhard Munch [id number] on 02/14/2022 4:27:04 PM  Radiology MR BRAIN WO CONTRAST  Result Date: 02/14/2022 CLINICAL DATA:  Stroke suspected EXAM: MRI HEAD WITHOUT CONTRAST TECHNIQUE: Multiplanar, multiecho pulse sequences of the brain and surrounding structures were obtained without intravenous contrast. COMPARISON:  Same-day CT head and head and neck angiogram FINDINGS: Brain: No acute infarction, hemorrhage, hydrocephalus, extra-axial collection or mass lesion. Sequela of mild chronic microvascular ischemic change. Vascular: Normal flow voids. Skull and upper cervical spine: Normal marrow signal. Sinuses/Orbits: Negative. Other: None. IMPRESSION: No acute intracranial abnormality. Electronically Signed   By: Lorenza Cambridge M.D.   On: 02/14/2022 15:25   CT ANGIO HEAD NECK W WO CM (CODE STROKE)  Result Date: 02/14/2022 CLINICAL DATA:  Neuro deficit, acute, stroke suspected EXAM: CT ANGIOGRAPHY HEAD AND NECK TECHNIQUE: Multidetector CT imaging of the head and neck was performed using the standard protocol during bolus administration of intravenous contrast. Multiplanar CT image reconstructions and MIPs were obtained to evaluate the vascular anatomy. Carotid stenosis measurements (when applicable) are obtained utilizing NASCET criteria, using the distal internal carotid diameter as the denominator. RADIATION DOSE REDUCTION: This exam was performed according to the departmental dose-optimization program which includes automated exposure control, adjustment of the mA and/or kV according to patient size and/or use of iterative reconstruction technique. CONTRAST:  40mL OMNIPAQUE IOHEXOL 350 MG/ML SOLN COMPARISON:  None Available. FINDINGS: CTA NECK FINDINGS Aortic arch: Great vessel origins are patent without  significant stenosis. Right carotid system: No evidence of dissection, stenosis (50% or greater), or occlusion. Left carotid system: No evidence of dissection, stenosis (50% or greater), or occlusion. Vertebral arteries: Right dominant. No evidence of dissection, stenosis (50% or greater), or occlusion. Skeleton: Moderate degenerative change at the C6-C7. Other neck: No acute findings. Upper chest: Probable expiratory changes in the lung apices. Otherwise, lung apices are clear. Review of the MIP images confirms the above findings CTA HEAD FINDINGS Anterior circulation: Bilateral intracranial ICAs, MCAs, and ACAs are patent without proximal hemodynamically significant stenosis. Posterior circulation: Bilateral intradural vertebral arteries, basilar artery, and bilateral posterior cerebral arteries are patent without proximal hemodynamically significant stenosis. Venous sinuses: As permitted by contrast timing, patent. Review of the MIP images confirms the above findings IMPRESSION: No emergent large vessel occlusion or proximal hemodynamically significant stenosis. Electronically Signed   By: Feliberto Harts M.D.   On: 02/14/2022 14:57   CT HEAD CODE STROKE WO CONTRAST  Result Date: 02/14/2022 CLINICAL DATA:  Code stroke. Neuro deficit, acute,  stroke suspected. EXAM: CT HEAD WITHOUT CONTRAST TECHNIQUE: Contiguous axial images were obtained from the base of the skull through the vertex without intravenous contrast. RADIATION DOSE REDUCTION: This exam was performed according to the departmental dose-optimization program which includes automated exposure control, adjustment of the mA and/or kV according to patient size and/or use of iterative reconstruction technique. COMPARISON:  Head CT 08/10/2018 FINDINGS: Brain: There is no evidence of an acute infarct, intracranial hemorrhage, mass, midline shift, or extra-axial fluid collection. The ventricles and sulci are within normal limits for age. Vascular: No  hyperdense vessel. Skull: No fracture or suspicious osseous lesion. Sinuses/Orbits: Paranasal sinuses and mastoid air cells are clear. Unremarkable orbits. Other: None. ASPECTS (Alberta Stroke Program Early CT Score) - Ganglionic level infarction (caudate, lentiform nuclei, internal capsule, insula, M1-M3 cortex): 7 - Supraganglionic infarction (M4-M6 cortex): 3 Total score (0-10 with 10 being normal): 10 These results were communicated to Dr. Selina Cooley at 2:44 pm on 02/14/2022 by text page via the [facility] messaging system. IMPRESSION: Negative head CT. Electronically Signed   By: Sebastian Ache M.D.   On: 02/14/2022 14:46    Procedures Procedures    Medications Ordered in ED Medications  sodium chloride flush (NS) 0.9 % injection 3 mL (3 mLs Intravenous Given 02/14/22 1619)  iohexol (OMNIPAQUE) 350 MG/ML injection 75 mL (75 mLs Intravenous Contrast Given 02/14/22 1448)  sodium chloride 0.9 % bolus 500 mL (0 mLs Intravenous Stopped 02/14/22 1758)  metoCLOPramide (REGLAN) injection 10 mg (10 mg Intravenous Given 02/14/22 1620)    ED Course/ Medical Decision Making/ A&P This patient with a Hx of migraines, bipolar disorder presents to the ED for concern of easiness, weakness, this involves an extensive number of treatment options, and is a complaint that carries with it a high risk of complications and morbidity.    The differential diagnosis includes CVA, complex migraine, TIA, electrolyte abnormalities, intoxication   Social Determinants of Health:  Bipolar disorder  Additional history obtained:  Additional history and/or information obtained from wife at bedside , notable for HPI   After the initial evaluation, orders, including: ct, labs, monitoring, eventually mri were initiated.   Patient placed on Cardiac and Pulse-Oximetry Monitors. The patient was maintained on a cardiac monitor.  The cardiac monitored showed an rhythm of 70 sr, nml The patient was also maintained on pulse oximetry. The  readings were typically 100 %RA, nml   On repeat evaluation of the patient improved 6:41 PM Patient awake, alert, demonstrating equal capacity in both upper extremities.  Patient is ambulatory. Lab Tests:  I personally interpreted labs.  The pertinent results include: Generally unremarkable labs, no influenza, no COVID, talk screen normal, alcohol level normal  Imaging Studies ordered:  I independently visualized and interpreted imaging which showed CT and MRI both generally unremarkable, no mass, no stroke I agree with the radiologist interpretation  Consultations Obtained:  I requested consultation with the neurology,  and discussed lab and imaging findings as well as pertinent plan - they recommend: With reassuring evaluation, no evidence for stroke, mass, seizure, given the patient's return to baseline he is appropriate for discharge  Dispostion / Final MDM:  After consideration of the diagnostic results and the patient's response to treatment, this adult male presents code stroke after developing weakness.  Notably the patient has had similar episodes, according to him his wife, some suspicion for complex migraine contributing to these given his reassuring findings here.  With weakness hospitalization was a consideration, given his improvement, absence of alarming  findings, labs, imaging, patient comfortable with discharge, close outpatient follow-up.  Final Clinical Impression(s) / ED Diagnoses Final diagnoses:  Weakness    Rx / DC Orders ED Discharge Orders     None         Gerhard MunchLockwood, Allysa Governale, MD 02/14/22 [phone]

## 2022-02-14 NOTE — Discharge Instructions (Addendum)
As discussed, your evaluation today has been largely reassuring.  But, it is important that you monitor your condition carefully, and do not hesitate to return to the ED if you develop new, or concerning changes in your condition. ? ?Otherwise, please follow-up with your physician for appropriate ongoing care. ? ?

## 2022-02-14 NOTE — ED Triage Notes (Signed)
Pt arrived via [facility] as a CODE STROKE LKW 1330. S/S reported by EMS:R arm and leg weakness, grip strength inconsistent intermittently upon exam, R sided headache and R facial numbness. Pt is not on thinners.See neurologists note regarding pt presentation upon arrival to ED for details. VSS w/ EMS, 18g R AC.
